# Patient Record
Sex: Male | Born: 1975 | Race: White | Hispanic: No | Marital: Married
Health system: Western US, Academic
[De-identification: ages and names within clinical notes are randomized; demographics above are authoritative.]

---

## 2019-03-09 ENCOUNTER — Telehealth (INDEPENDENT_AMBULATORY_CARE_PROVIDER_SITE_OTHER): Payer: Self-pay | Admitting: Pulmonary Disease

## 2019-03-09 NOTE — Telephone Encounter (Signed)
Called patient to ask him to activate myc hart for next weeks video appointment     Left voice message stating email with link was send to activate my chart

## 2019-03-16 ENCOUNTER — Institutional Professional Consult (permissible substitution) (INDEPENDENT_AMBULATORY_CARE_PROVIDER_SITE_OTHER): Payer: TRICARE Prime—HMO | Admitting: Pulmonary Disease

## 2019-03-16 ENCOUNTER — Encounter (INDEPENDENT_AMBULATORY_CARE_PROVIDER_SITE_OTHER): Payer: Self-pay

## 2020-10-04 IMAGING — MR MRI SHOULDER LT WO CONTRAST
4 series · 40 of 40 positions shown · non-contrast
Comparison: None.

INDICATION: Left arm numbness and tingling.
TECHNIQUE: Multiplanar, multiecho imaging of the left shoulder was performed, including T1-weighted and fluid sensitive sequences without intravenous contrast.

[Series 2: t2_axial_fs · axial · 4.0mm · 0.50mm/px · z∈[-59,+17]mm · 8 of 20 slices shown]
[im 1/20]
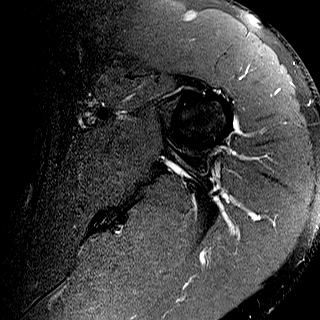
[im 3/20]
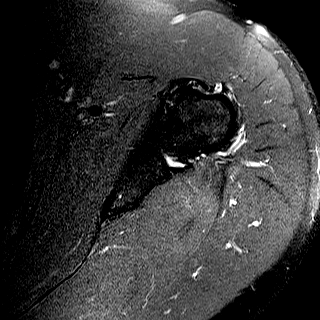
[im 6/20]
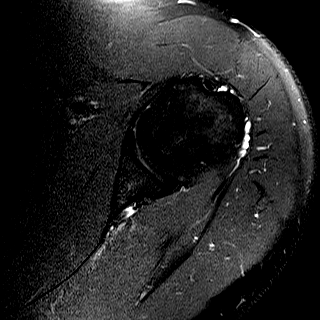
[im 9/20]
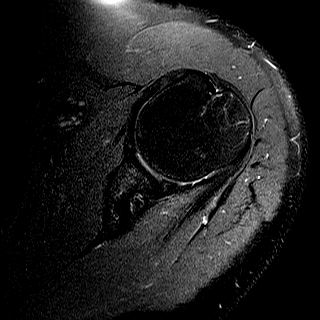
[im 11/20]
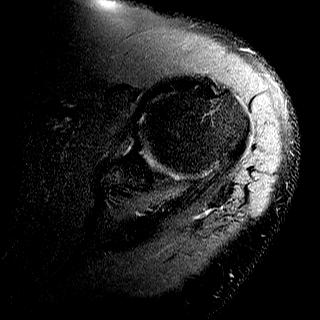
[im 14/20]
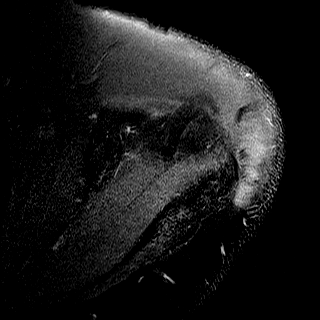
[im 17/20]
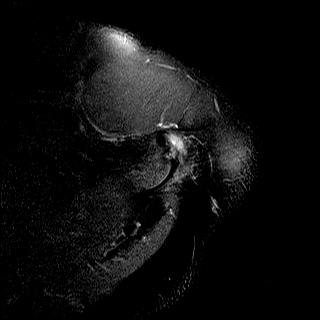
[im 20/20]
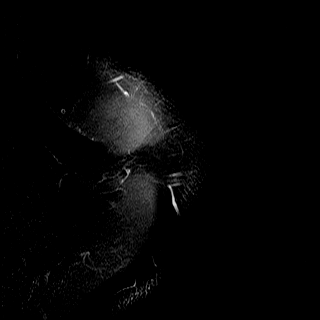

[Series 3: t2_cor_obl_fs · oblique · 4.0mm · 0.50mm/px · 8 of 18 slices shown]
[im 1/18]
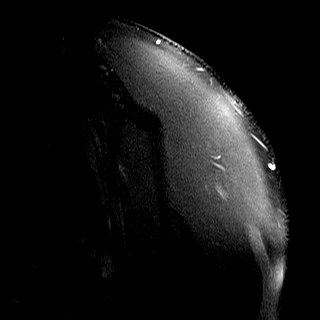
[im 3/18]
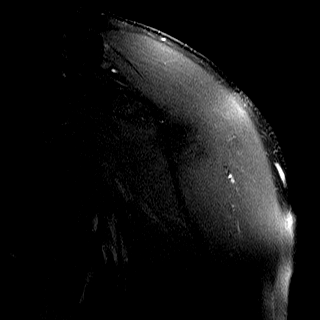
[im 5/18]
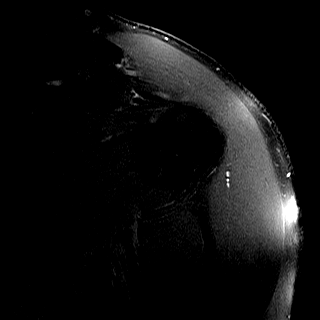
[im 8/18]
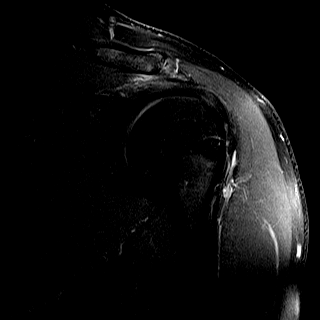
[im 10/18]
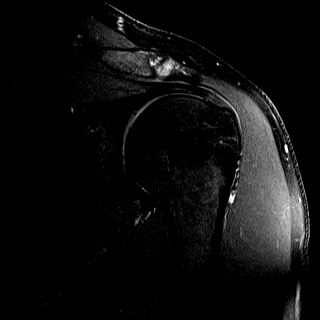
[im 13/18]
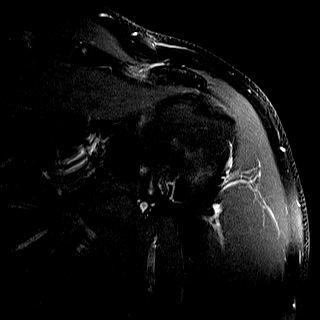
[im 15/18]
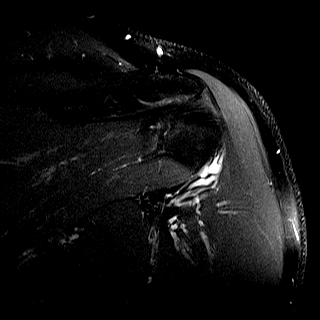
[im 18/18]
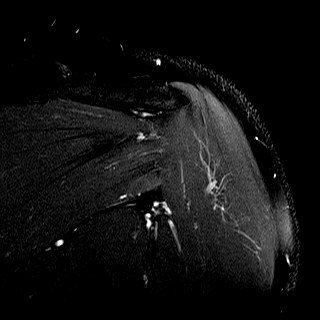

[Series 4: t1_sag_obl · oblique · 4.0mm · 0.50mm/px · 12 of 27 slices shown]
[im 1/27]
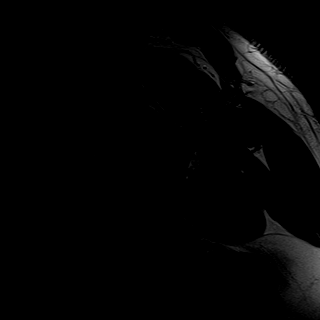
[im 3/27]
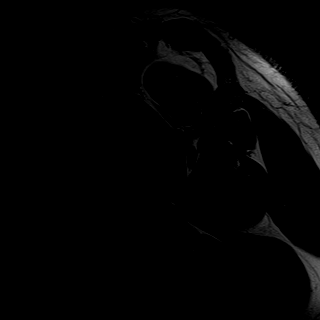
[im 5/27]
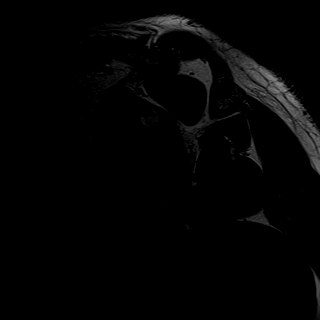
[im 8/27]
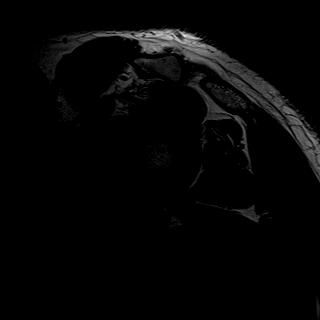
[im 10/27]
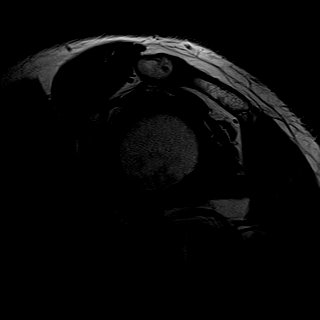
[im 12/27]
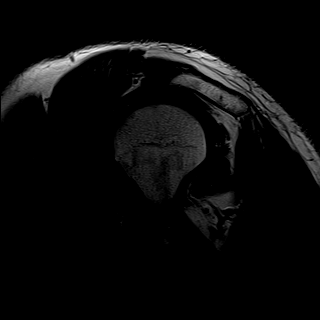
[im 15/27]
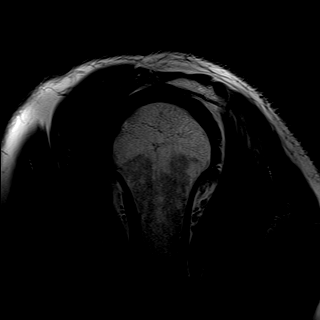
[im 17/27]
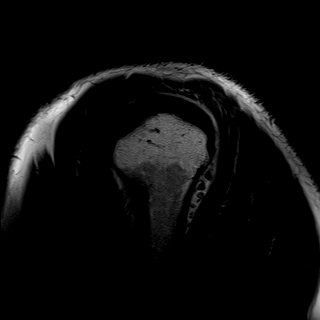
[im 19/27]
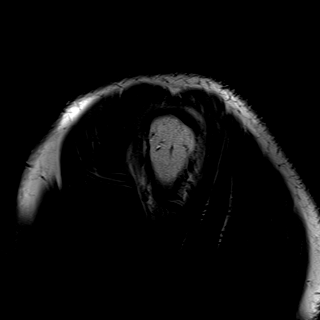
[im 22/27]
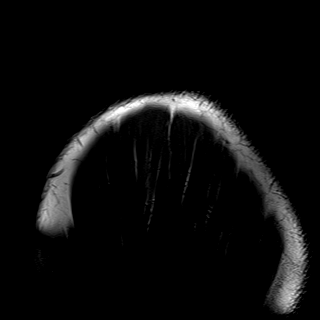
[im 24/27]
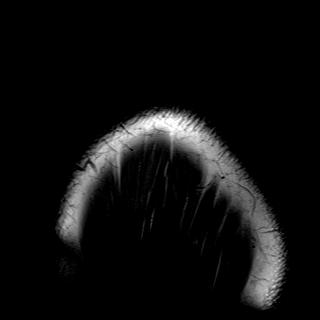
[im 27/27]
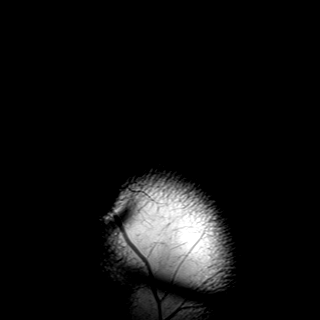

[Series 5: t2_sag_obl_fs · oblique · 4.0mm · 0.50mm/px · 12 of 27 slices shown]
[im 1/27]
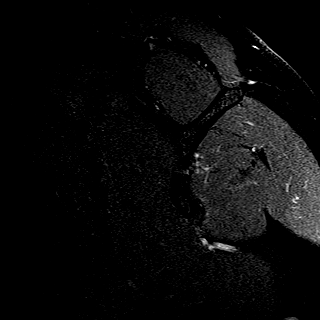
[im 3/27]
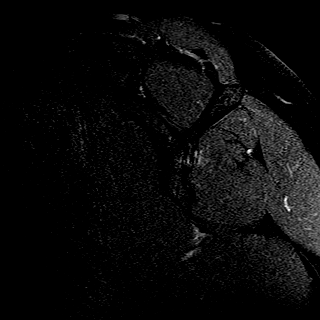
[im 5/27]
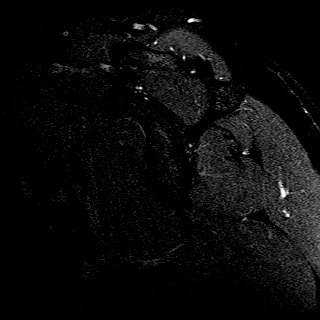
[im 8/27]
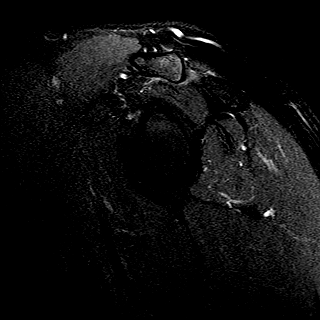
[im 10/27]
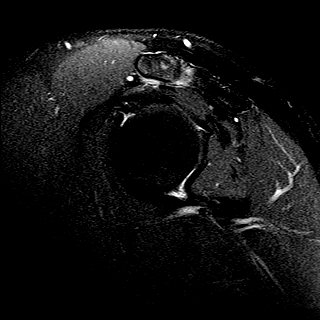
[im 12/27]
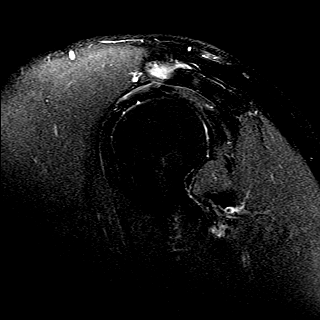
[im 15/27]
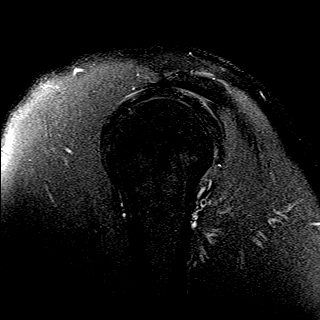
[im 17/27]
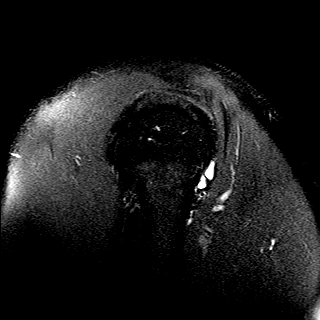
[im 19/27]
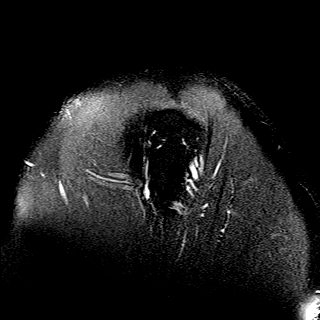
[im 22/27]
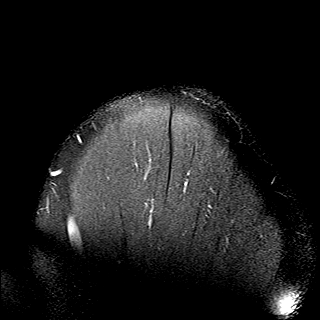
[im 24/27]
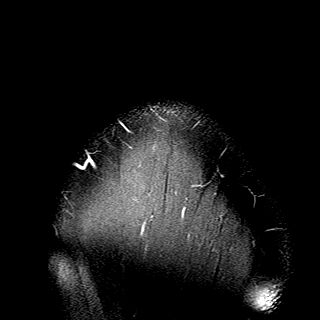
[im 27/27]
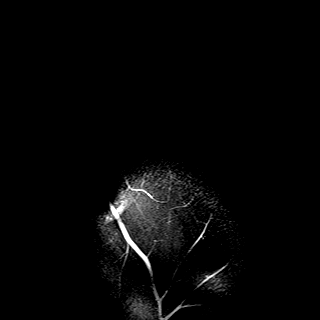

[40 of 40 positions shown; findings below may reference images not displayed]

FINDINGS: Rotator cuff:

Supraspinatus and infraspinatus: Trace tendinosis of the supraspinatus. No tear. Infraspinatus tendon is intact.

Subscapularis: Intact.

Teres minor: Intact. 

Cuff muscles: Normal in size and signal intensity.  No fatty infiltration.

Acromioclavicular joint: Mild DJD.

FRANK DOZZO bursa: No bursitis.

Long head biceps tendon: Intact, with normal course.

Rotator Interval: No scar or obliteration of fat.

Labrum: Degeneration of the posterior superior labrum without definitive tear.

Cartilage: Intact.

Marrow: Within normal limits.

No joint effusion. No mass. No fluid collection.
IMPRESSION: 1.
Degeneration of the posterior superior labrum with no definitive tear.

2.
Mild AC joint DJD.

## 2020-10-04 IMAGING — MR MRI CSPINE WO CONTRAST
5 series · 43 of 48 positions shown · non-contrast
Comparison: None

INDICATION: Left arm numbness and tingling.
TECHNIQUE: Multiplanar, multiecho imaging of the cervical spine was performed, including T1-weighted and fluid sensitive sequences without Intravenous contrast.

[Series 1: bSSFP · axial · 6.0mm · 1.17mm/px · z∈[-66,+147]mm · 11 of 22 slices shown]
[im 1/22]
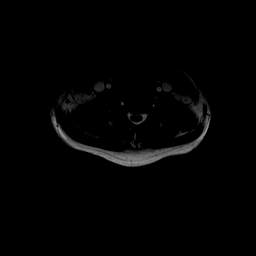
[im 3/22]
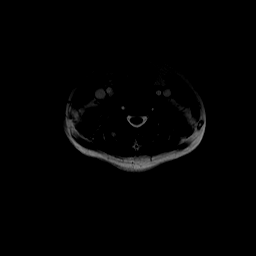
[im 5/22]
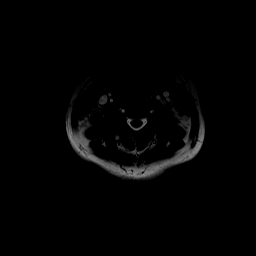
[im 7/22]
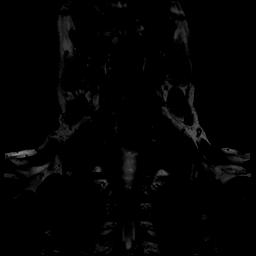
[im 9/22]
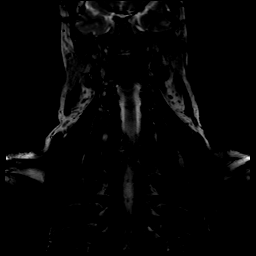
[im 11/22]
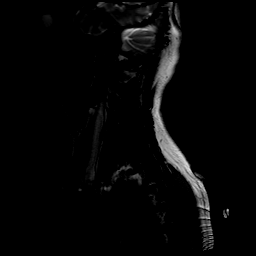
[im 13/22]
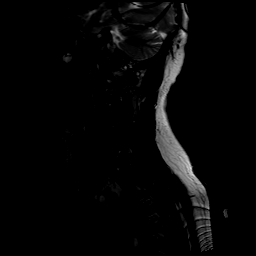
[im 15/22]
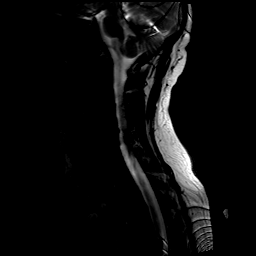
[im 17/22]
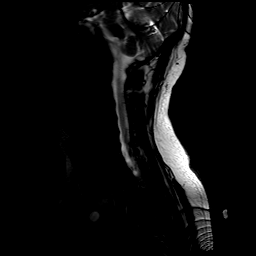
[im 19/22]
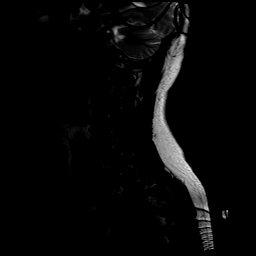
[im 22/22]
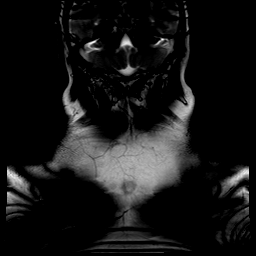

[Series 2: t2_sag · sagittal · 3.0mm · 0.66mm/px · 7 of 14 slices shown]
[im 1/14]
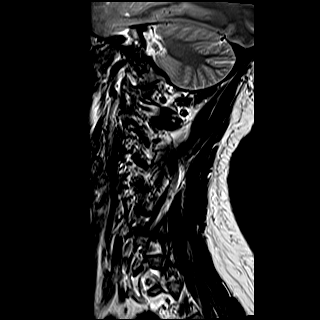
[im 3/14]
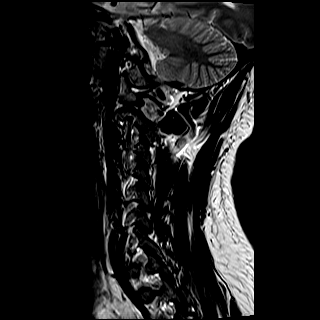
[im 5/14]
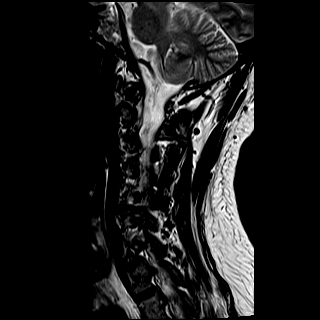
[im 7/14]
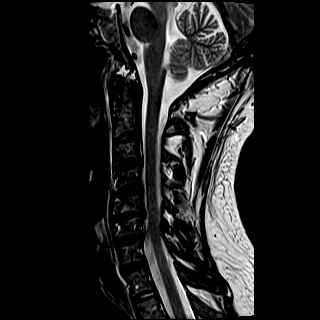
[im 9/14]
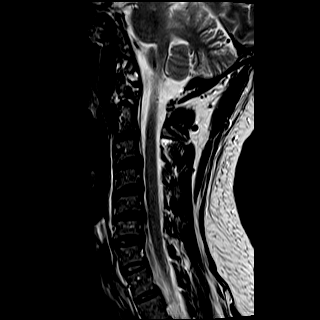
[im 11/14]
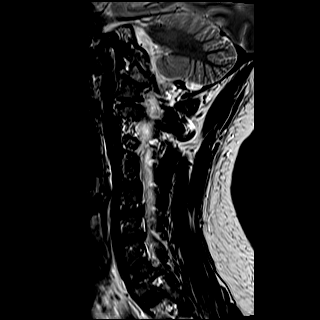
[im 14/14]
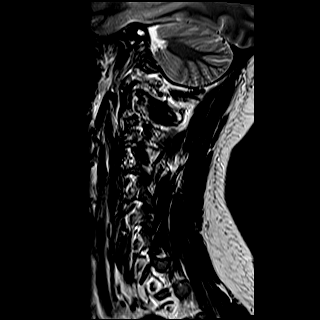

[Series 3: t1_sag · sagittal · 3.0mm · 0.66mm/px · 7 of 14 slices shown]
[im 1/14]
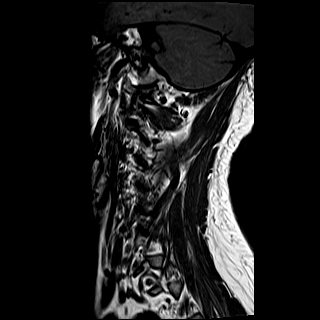
[im 3/14]
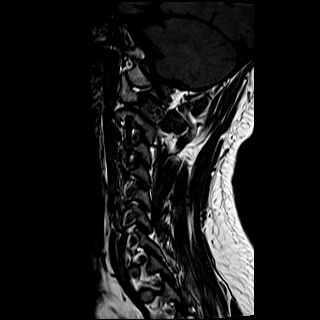
[im 5/14]
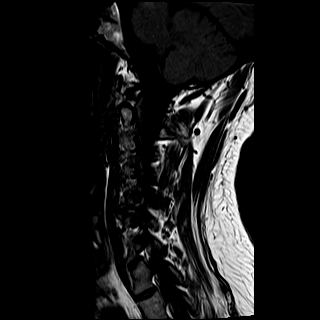
[im 7/14]
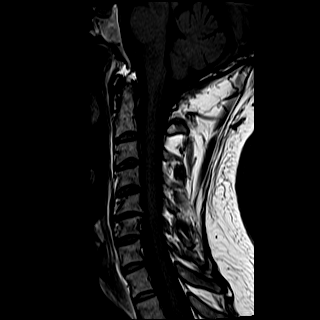
[im 9/14]
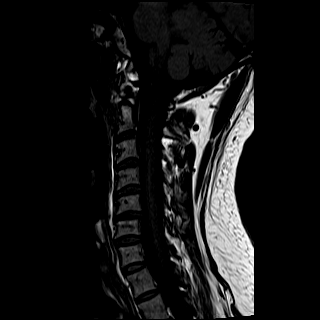
[im 11/14]
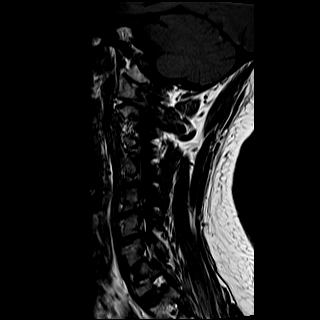
[im 14/14]
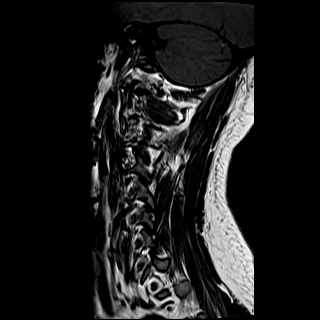

[Series 4: ir_sag · sagittal · 3.0mm · 0.82mm/px · 7 of 14 slices shown]
[im 1/14]
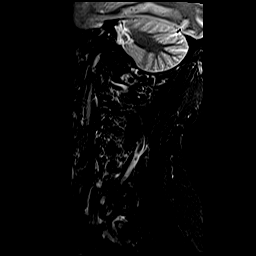
[im 3/14]
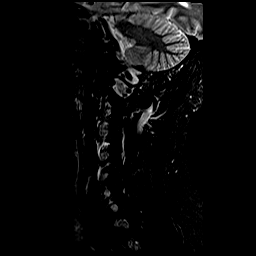
[im 5/14]
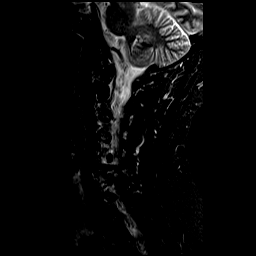
[im 7/14]
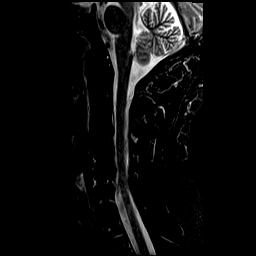
[im 9/14]
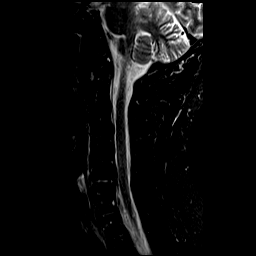
[im 11/14]
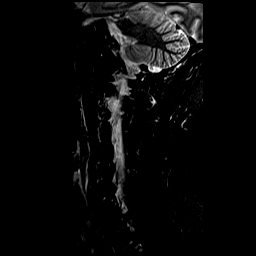
[im 14/14]
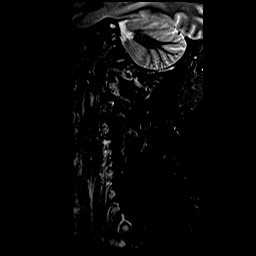

[Series 5: t2_medic_axial · axial · 3.0mm · 0.29mm/px · z∈[-74,+50]mm · 11 of 33 slices shown]
[im 1/33]
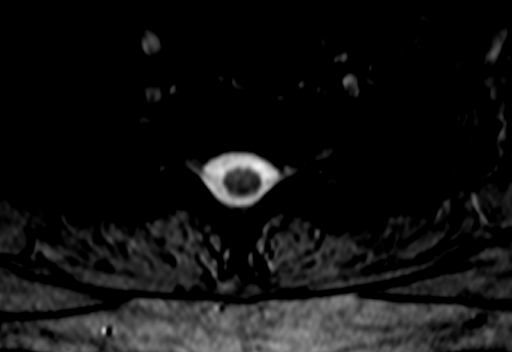
[im 3/33]
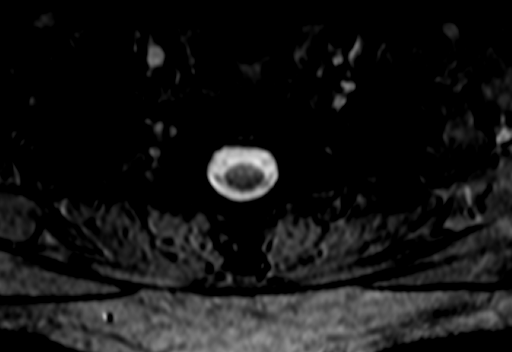
[im 5/33]
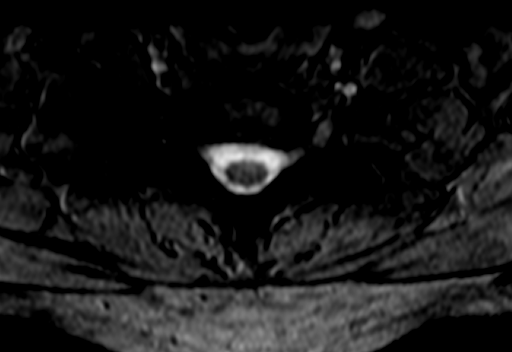
[im 7/33]
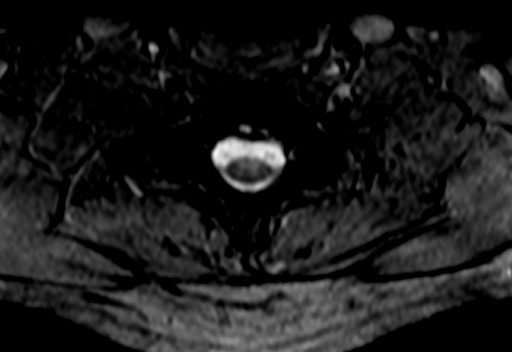
[im 9/33]
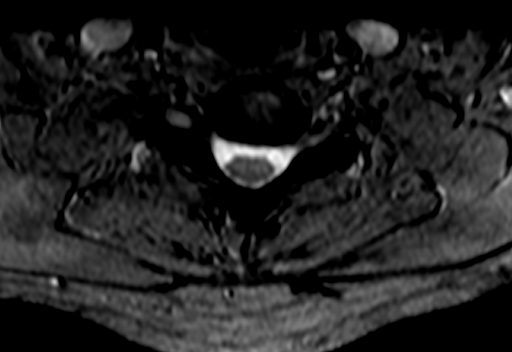
[im 11/33]
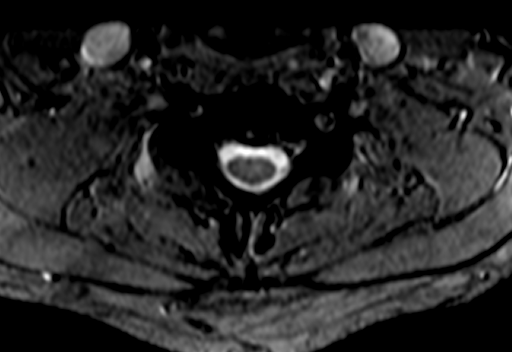
[im 15/33]
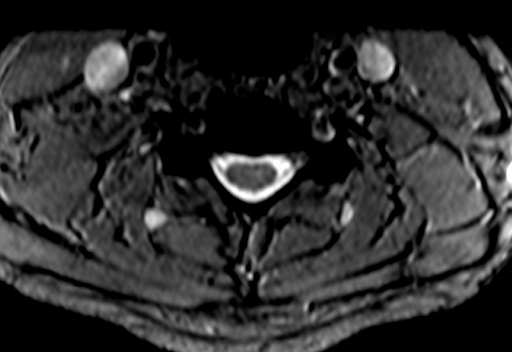
[im 20/33]
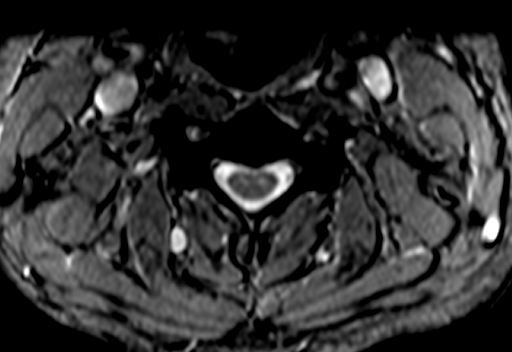
[im 24/33]
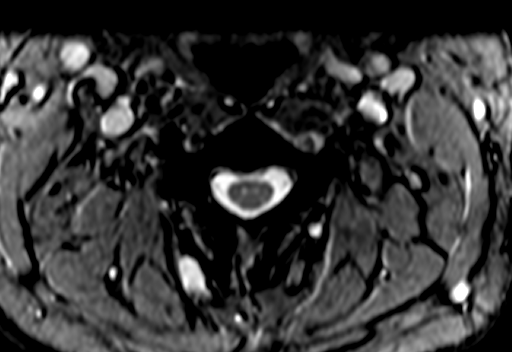
[im 28/33]
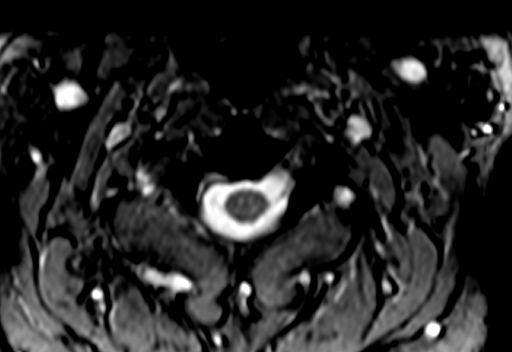
[im 33/33]
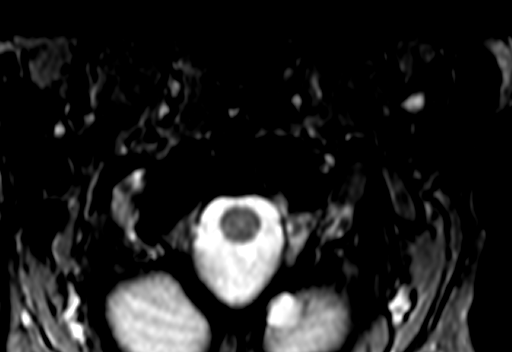

[43 of 48 positions shown; findings below may reference images not displayed]

FINDINGS: Posterior fossa:  The visualized posterior fossa is normal in appearance.  

Cervical spinal cord:  The cervical spinal cord has normal signal intensity.  

Bone marrow:  Mild discogenic endplate reactive marrow changes at C5-C6. No fracture. 

Alignment: Minimal cervical levoscoliosis. No listhesis.

C2-C3: No disc protrusion, canal narrowing, or foraminal narrowing.

C3-C4: Mild disc osteophyte complex. No foraminal stenosis. Mild canal stenosis. No facet arthrosis.

C4-C5: Mild disc osteophyte complex. Mild canal stenosis. No foraminal stenosis. No facet arthrosis.

C5-C6: Mild disc osteophyte complex. Mild canal stenosis. Moderate right and mild left foraminal stenosis. No facet arthrosis.

C6-C7: Mild disc osteophyte complex. No canal stenosis. Mild right foraminal stenosis. No facet arthrosis.

C7-T1: No disc protrusion, canal narrowing, or foraminal narrowing.

T1-T2: No disc protrusion. No canal or foraminal stenosis. No facet arthrosis.

Paravertebral soft tissues are unremarkable.
IMPRESSION: 1.
Mild disc osteophyte complex at C5-C6 with mild canal and moderate right foraminal stenosis.

2.
Mild disc osteophyte complex at C3-C5 with mild canal stenosis.

## 2020-10-16 IMAGING — MR MRI BRAIN W/WO CONTRAST
10 of 16 series · 31 of 48 positions shown · IV contrast (prohance)
Comparison: Cervical spine MR study 10/04/2020.

HISTORY: Abnormal hormones, hypothyroidism, possible adenoma
TECHNIQUE: Multiplanar, multisequential MRI images of the brain and pituitary gland are obtained prior to and following 20 mL ProHance intravenous contrast. Dynamic pituitary gland enhancement protocol utilized.

[Series 1: bSSFP · axial · 8.0mm · 1.17mm/px · z∈[-43,+129]mm · 5 of 19 slices shown]
[im 1/19]
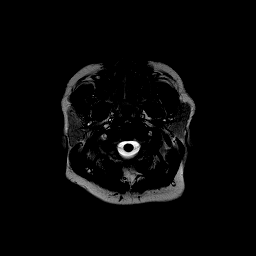
[im 5/19]
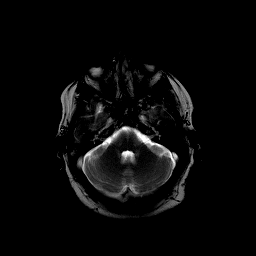
[im 10/19]
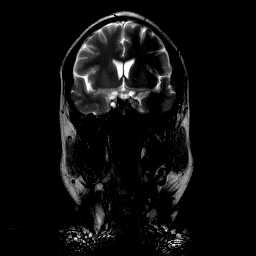
[im 14/19]
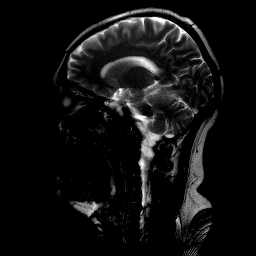
[im 19/19]
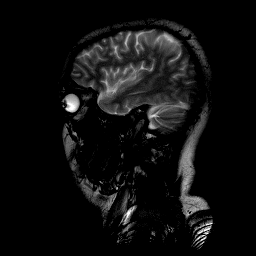

[Series 2: t1_sag · sagittal · 5.0mm · 0.75mm/px · 4 of 20 slices shown (1 of 2)]
[im 1/20]
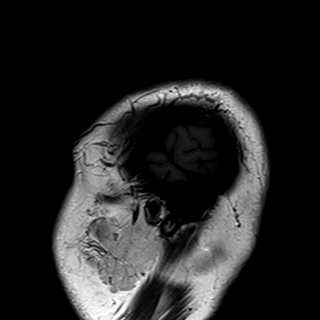
[im 7/20]
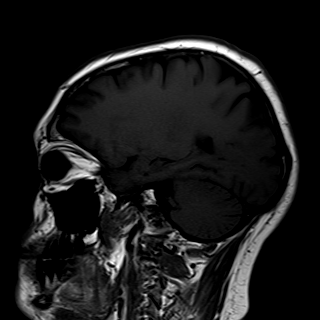
[im 13/20]
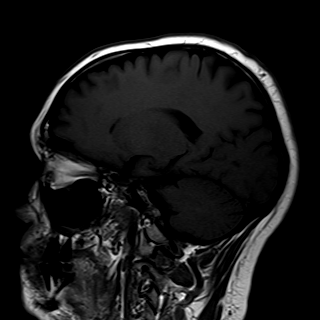
[im 20/20]
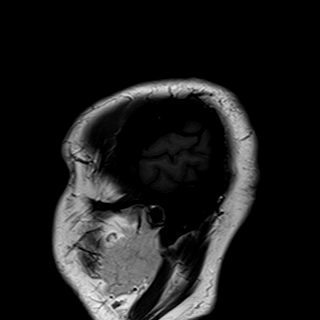

[Series 3: flair_axial_fs · axial · 5.0mm · 0.45mm/px · z∈[-51,+104]mm · 6 of 27 slices shown]
[im 1/27]
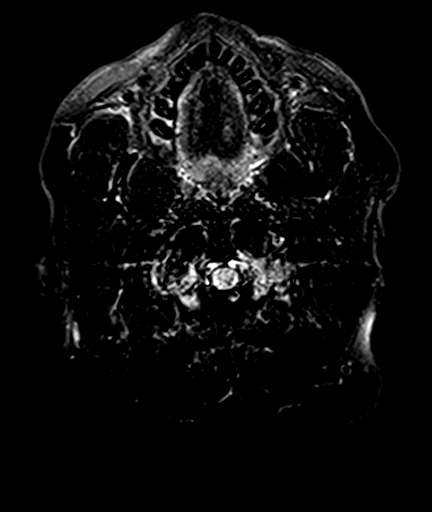
[im 6/27]
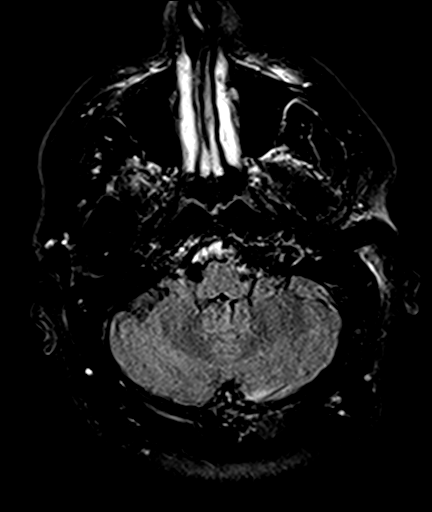
[im 11/27]
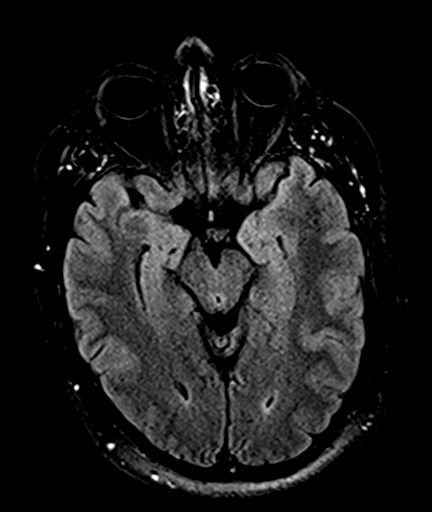
[im 16/27]
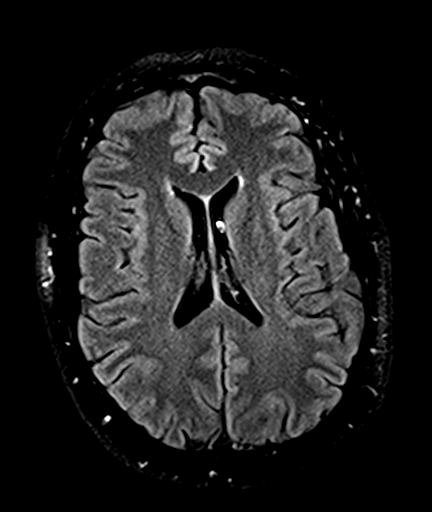
[im 21/27]
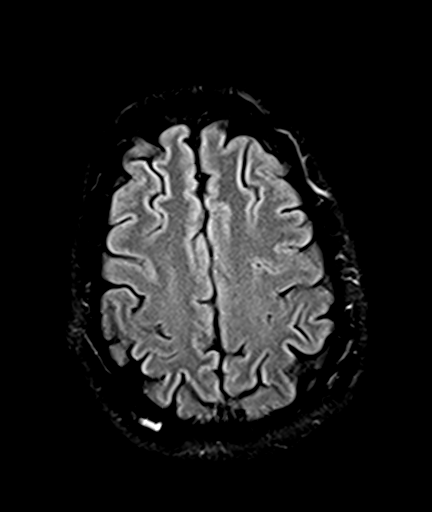
[im 27/27]
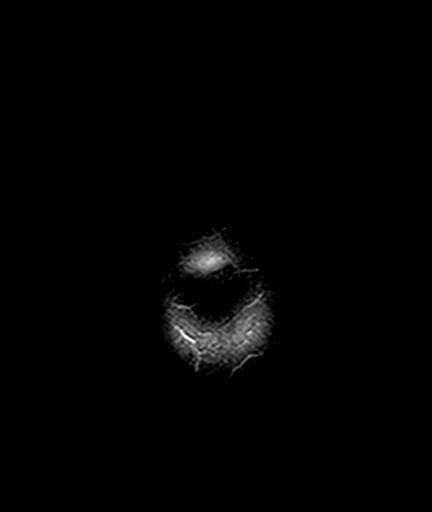

[Series 4: t2_cor_x2.5 · coronal · 3.0mm · 0.78mm/px · 3 of 15 slices shown]
[im 1/15]
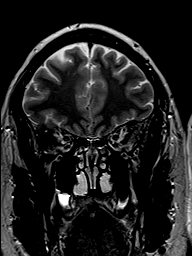
[im 8/15]
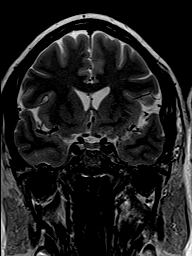
[im 15/15]
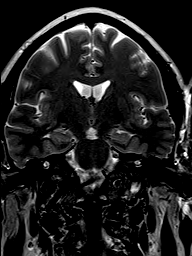

[Series 5: t1_sag · sagittal · 3.0mm · 0.44mm/px · 2 of 11 slices shown (2 of 2)]
[im 1/11]
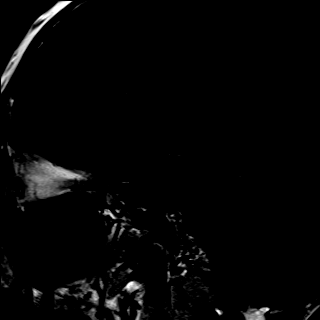
[im 11/11]
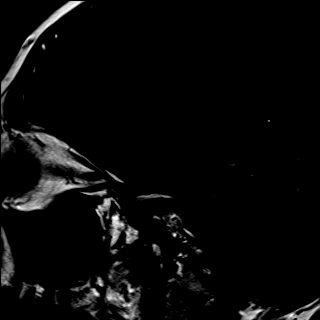

[Series 6: t1_cor_fs · coronal · 3.0mm · 0.78mm/px · 3 of 13 slices shown]
[im 1/13]
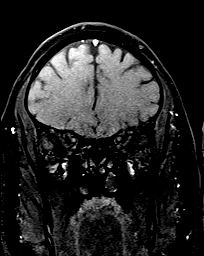
[im 7/13]
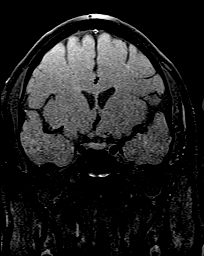
[im 13/13]
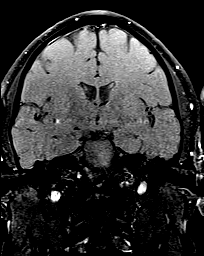

[Series 7: t1_cor · coronal · 3.0mm · 0.31mm/px · 2 of 9 slices shown]
[im 1/9]
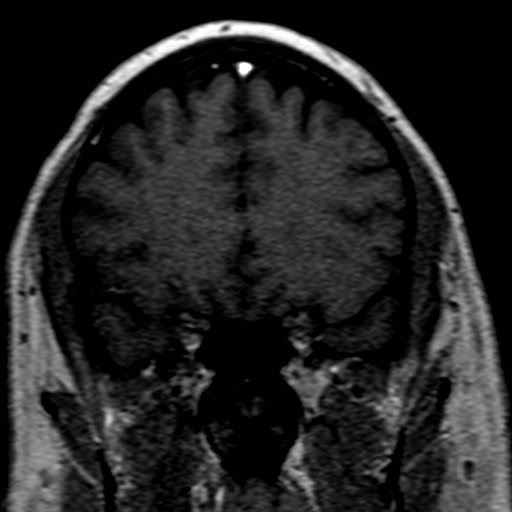
[im 9/9]
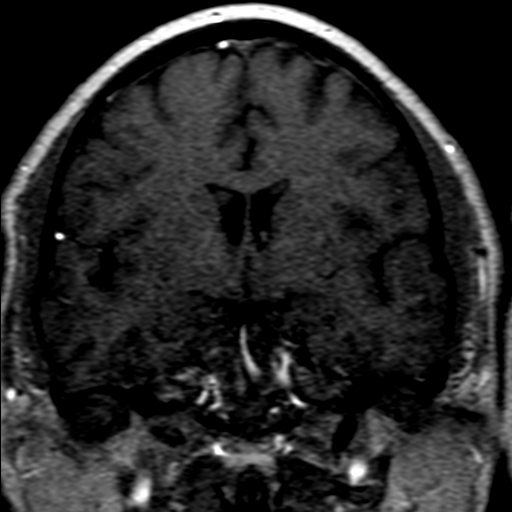

[Series 8: t1_cor_0 · coronal · 3.0mm · 0.31mm/px · 2 of 9 slices shown]
[im 1/9]
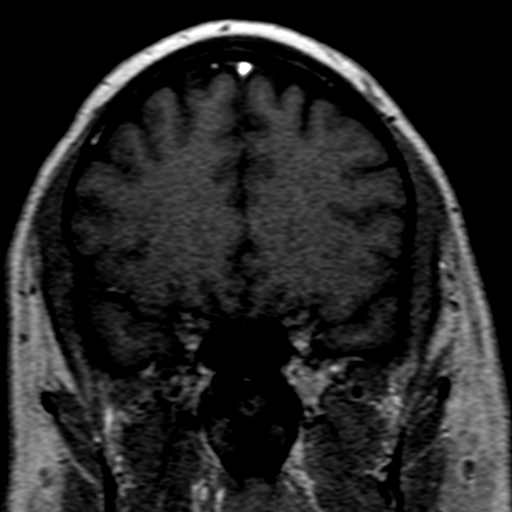
[im 9/9]
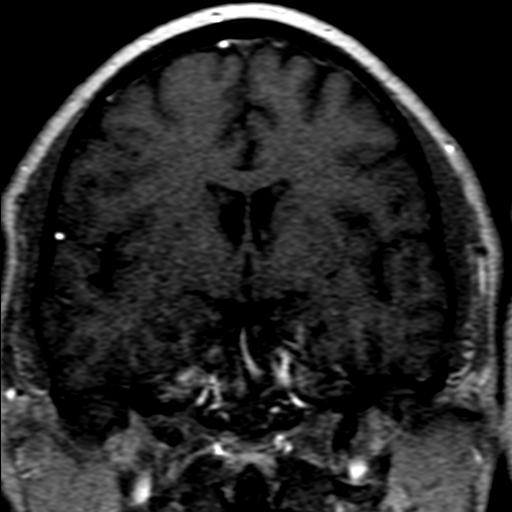

[Series 9: t1_cor_30 · coronal · 3.0mm · 0.31mm/px · 2 of 9 slices shown]
[im 1/9]
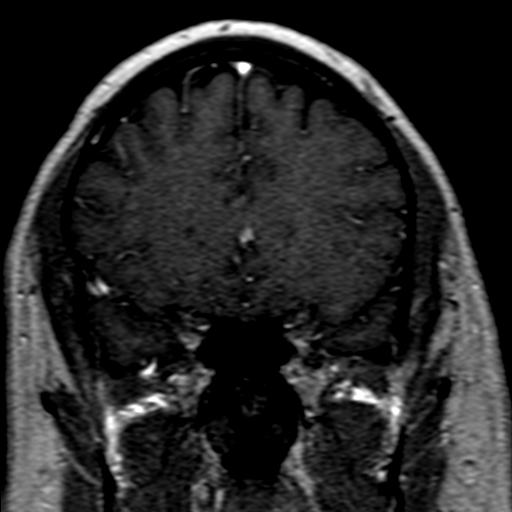
[im 9/9]
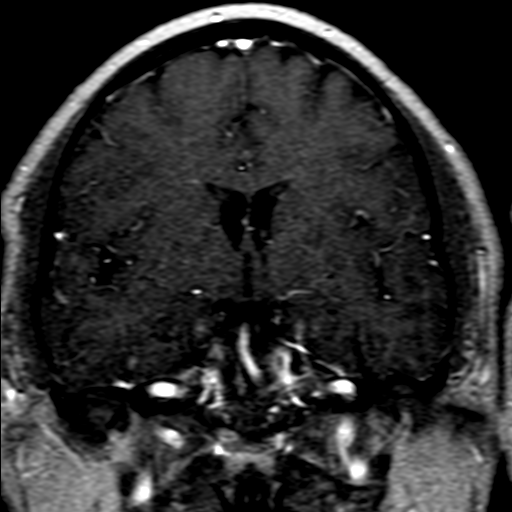

[Series 10: t1_cor_60 · coronal · 3.0mm · 0.31mm/px · 2 of 9 slices shown]
[im 1/9]
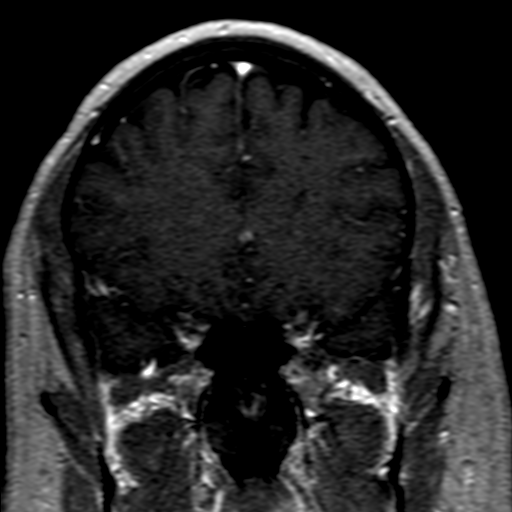
[im 9/9]
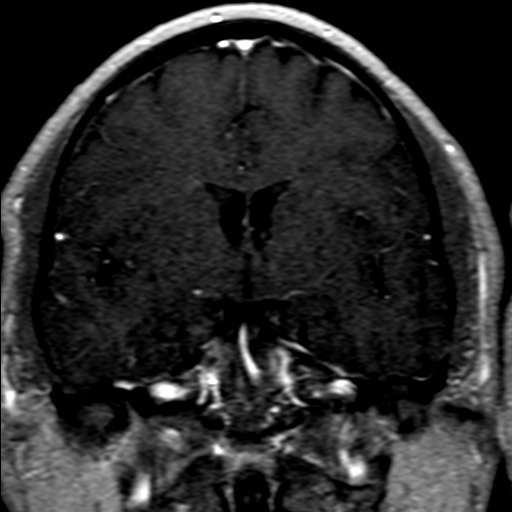

[31 of 48 positions shown; findings below may reference images not displayed]

FINDINGS: Ventricles and cortical sulci are normal. There is no abnormal intra-axial signal. Midline intracranial structures normal. Flow voids of main intracranial vasculature are normal. There is no intracranial mass lesion or midline shift. No extra-axial fluid collection identified.

Mild mucosal thickening of ethmoid air cells identified. Mucous retention cysts or polyp at inferior aspect of right maxillary sinus seen. The rest of the orbits, paranasal sinuses, mastoid air cells and skull marrow signal are normal.

Pituitary gland shows normal morphology and signal. Pituitary stalk is normal. No pituitary adenoma is identified. No abnormal enhancement of pituitary gland is seen. Cavernous sinuses and Meckel's caves are normal. Optic tracts, optic chiasma and optic nerves are normal.
IMPRESSION: Chronic paranasal sinusitis identified.

No pituitary adenoma is identified.

No intracranial pathology.

## 2020-11-03 IMAGING — US DOP ARTERIAL LWR EXT RT
1 series · 14 of 16 positions shown · non-contrast
Comparison: None.

HISTORY: 45 years-old male with pain in the right leg.
TECHNIQUE: Multiple gray-scale and color-flow images of the right lower extremity arteries were obtained with ultrasound.  Spectral Doppler analysis was also performed.

[Series 1: dop arterial lwr ext right · arterial · 14 of 32 slices shown]
[im 1/32]
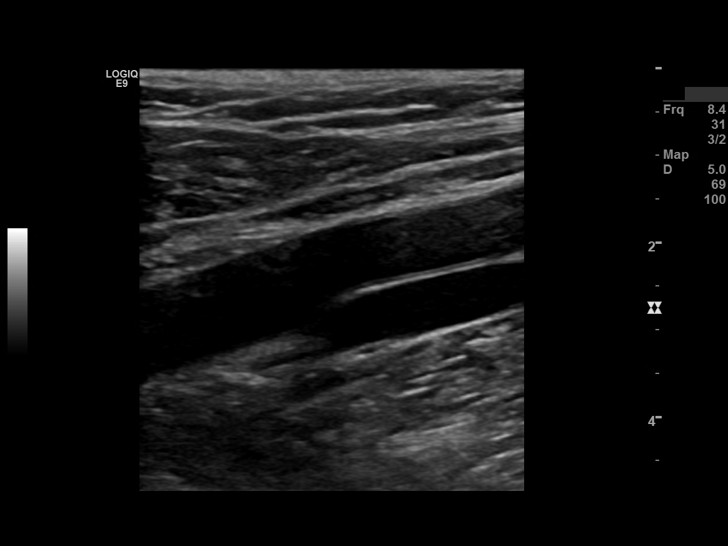
[im 3/32]
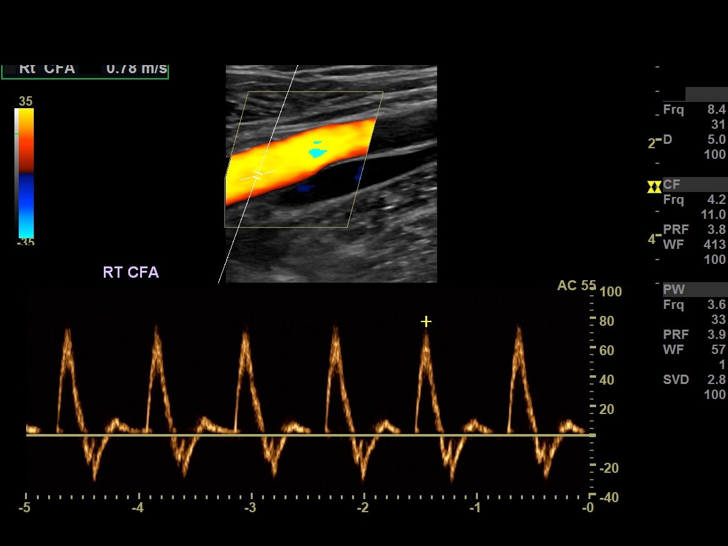
[im 5/32]
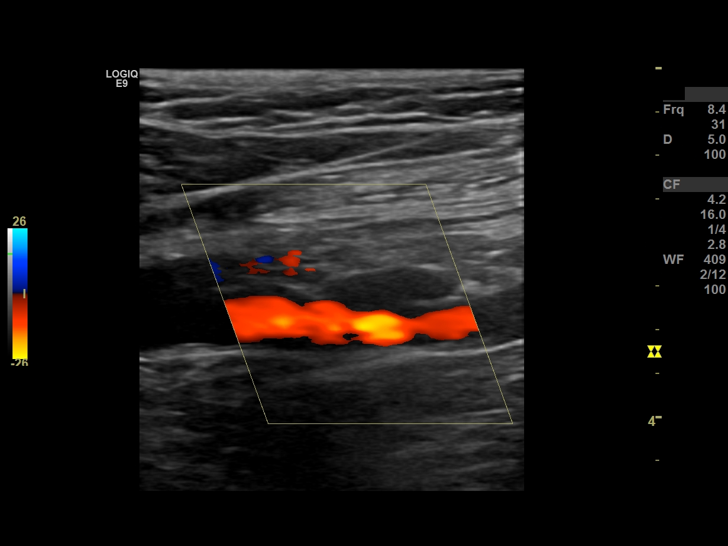
[im 9/32]
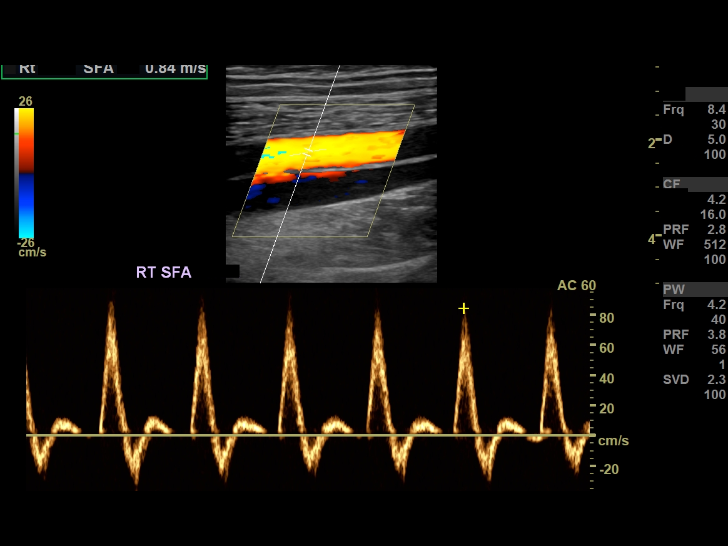
[im 11/32]
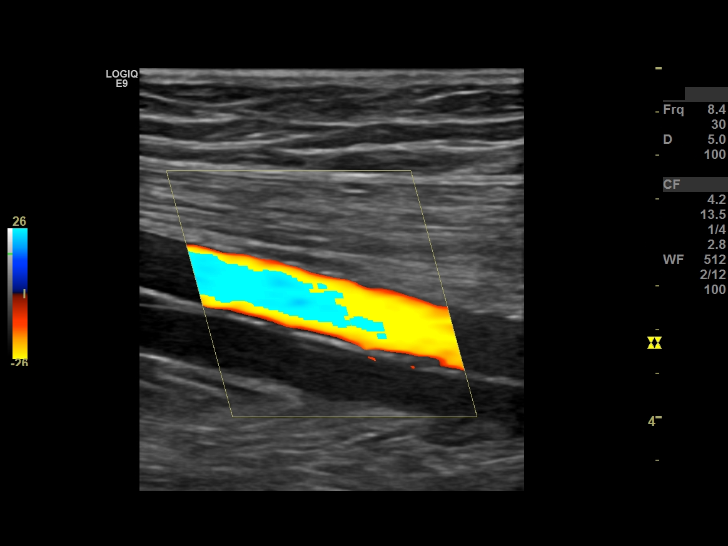
[im 13/32]
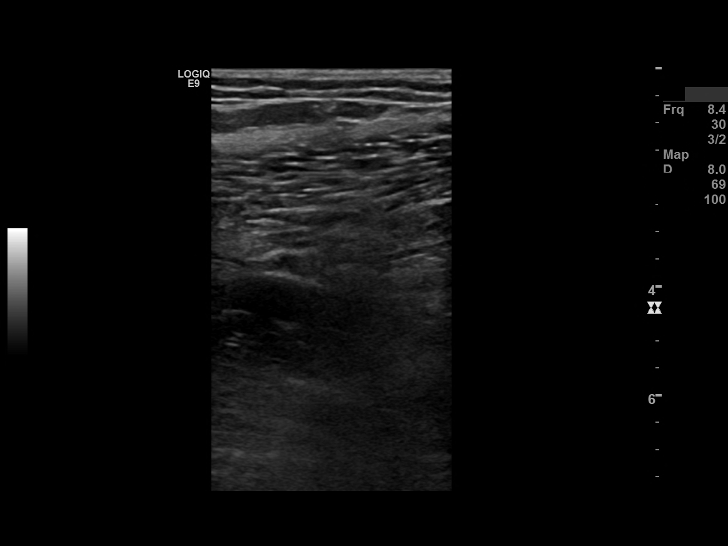
[im 15/32]
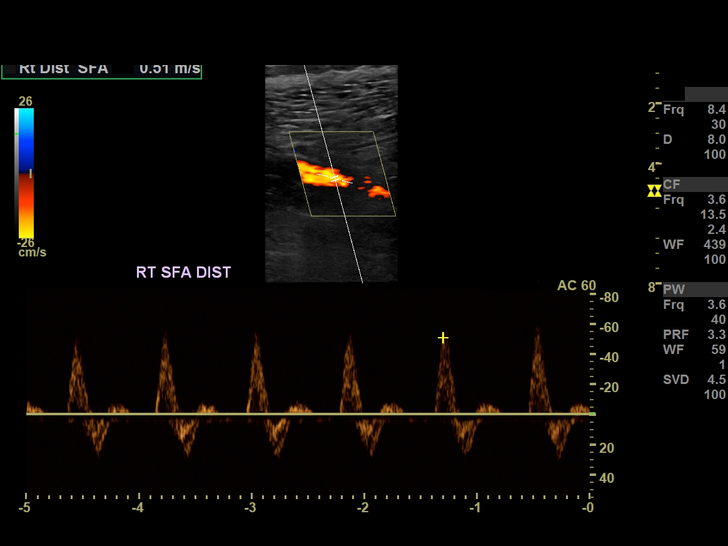
[im 17/32]
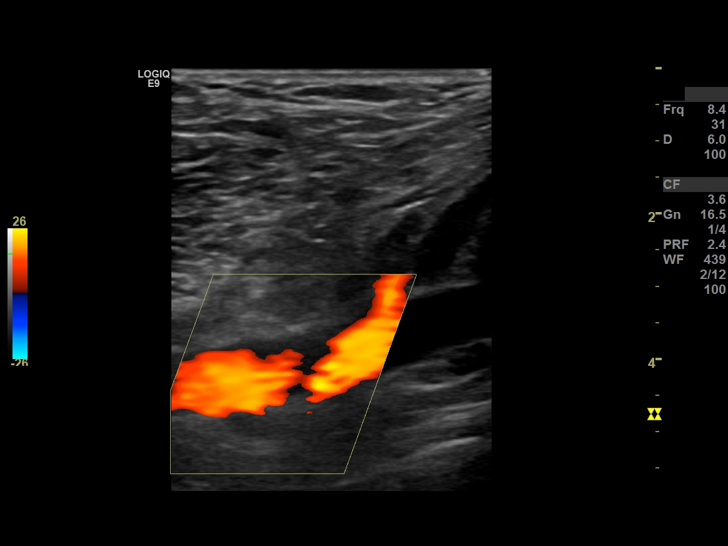
[im 19/32]
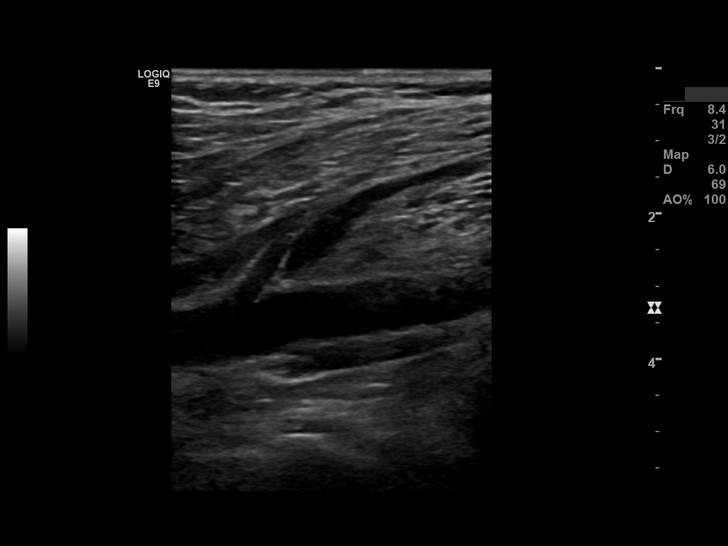
[im 21/32]
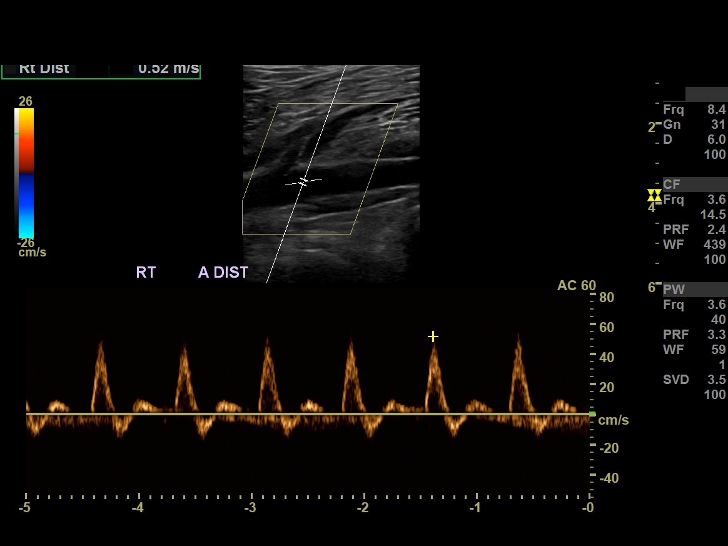
[im 25/32]
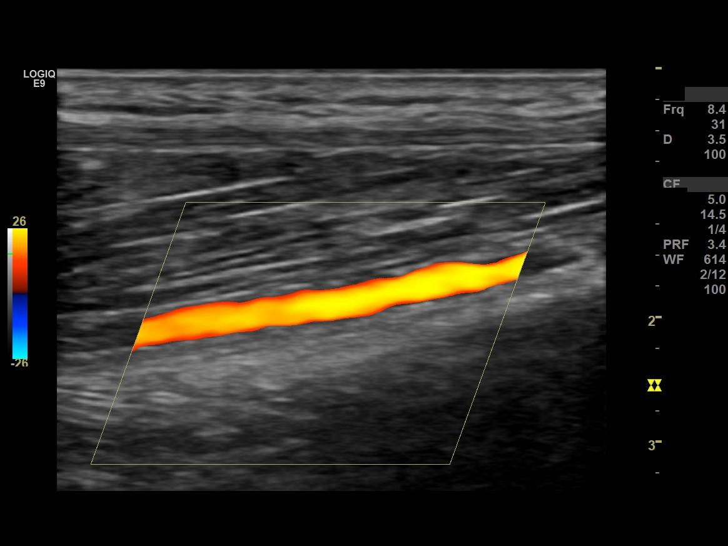
[im 27/32]
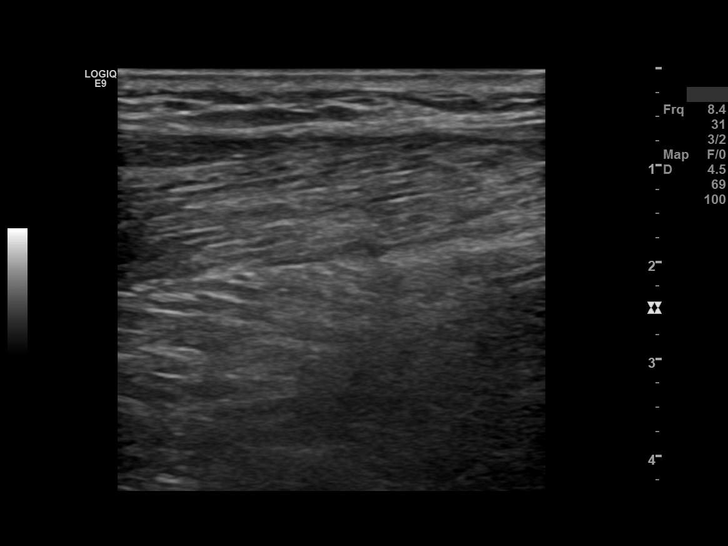
[im 29/32]
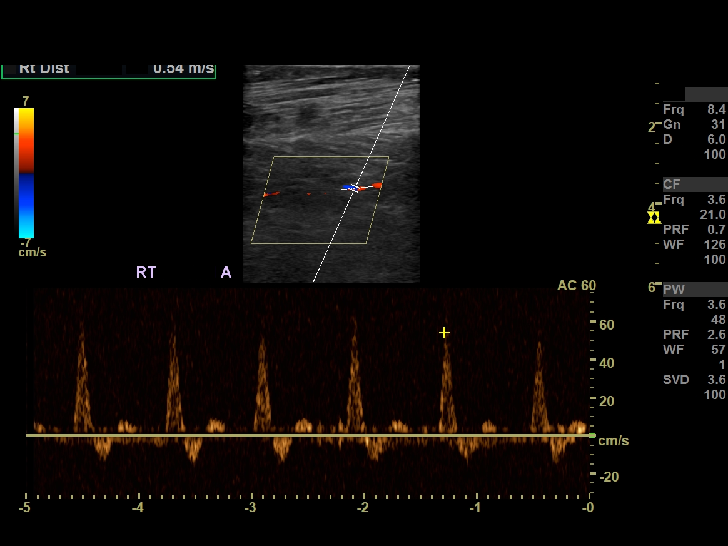
[im 32/32]
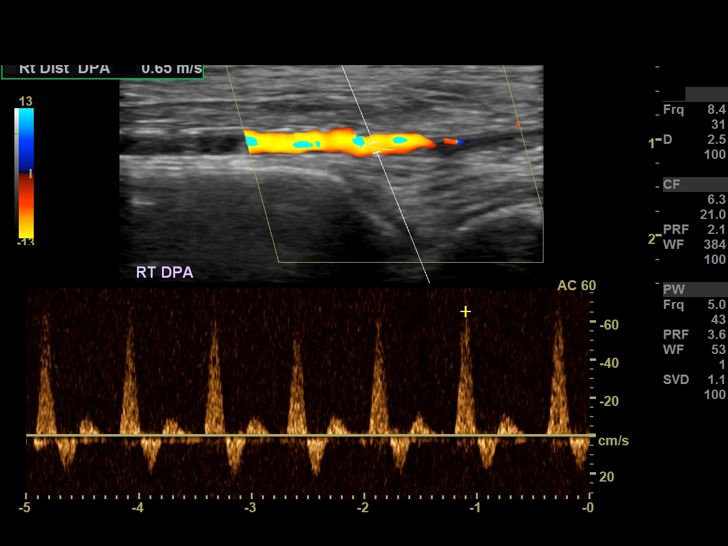

[14 of 16 positions shown; findings below may reference images not displayed]

FINDINGS: The right leg arterial system demonstrates the following waveforms and peak systolic velocities (m/s):

Common femoral artery: Triphasic,

Profunda femoral artery: Triphasic,

Superficial femoral artery proximal: Triphasic,

Superficial femoral artery mid: Triphasic,

Superficial femoral artery distal: Triphasic,

Popliteal artery proximal: Triphasic,

Popliteal artery distal: Triphasic,

Anterior tibial artery: Triphasic,

Posterior tibial artery: Triphasic,

Peroneal artery: Triphasic,

Dorsalis pedis artery: Triphasic,

Cardiac rhythm: Regular.
IMPRESSION: No significant peripheral vascular disease in the right leg arterial system.

## 2020-11-10 IMAGING — RF URETHROCYSTOGRAM RETRO
5 series · 10 of 10 positions shown · non-contrast
Comparison: None

HISTORY/INDICATIONS:  Urethral stricture.

PROCEDURE:  Retrograde urethrogram.
RADIATION DOSE:
Fluoroscopic images: 22
Fluoroscopy time: 2.1 minutes
Dose area product: 2274.58 microGy*m2
Reference air kerma: 47.10 mGy
DESCRIPTION OF PROCEDURE:
After cannulation of the urethra at the penile urethra and injection of contrast, images of the anterior urethra were performed.

[Series 1: fluoro_barium_bariatric 2fps_(person_nam · 0.19mm/px · 1 of 1 slices shown (1 of 5)]
[im 1/1]
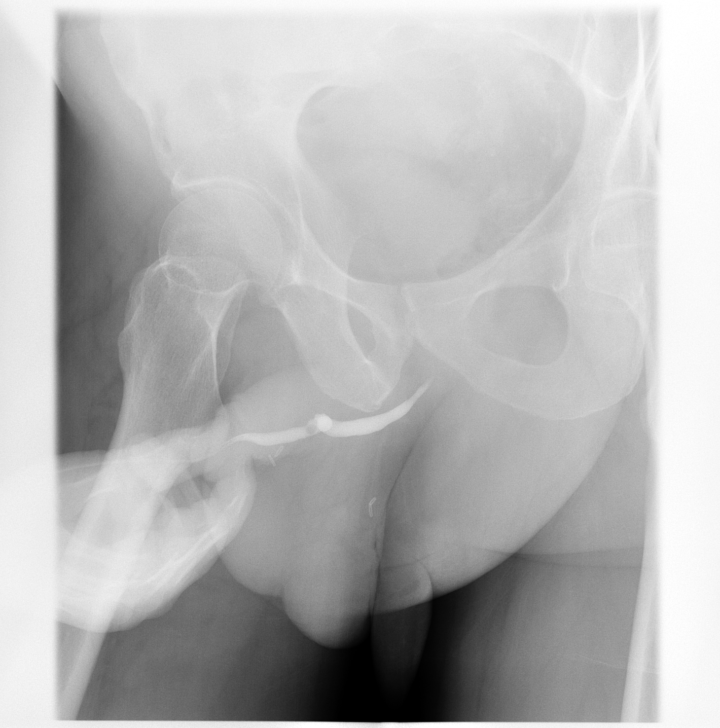

[Series 2: fluoro_barium_bariatric 2fps_(person_nam · 0.19mm/px · 1 of 1 slices shown (2 of 5)]
[im 1/1]
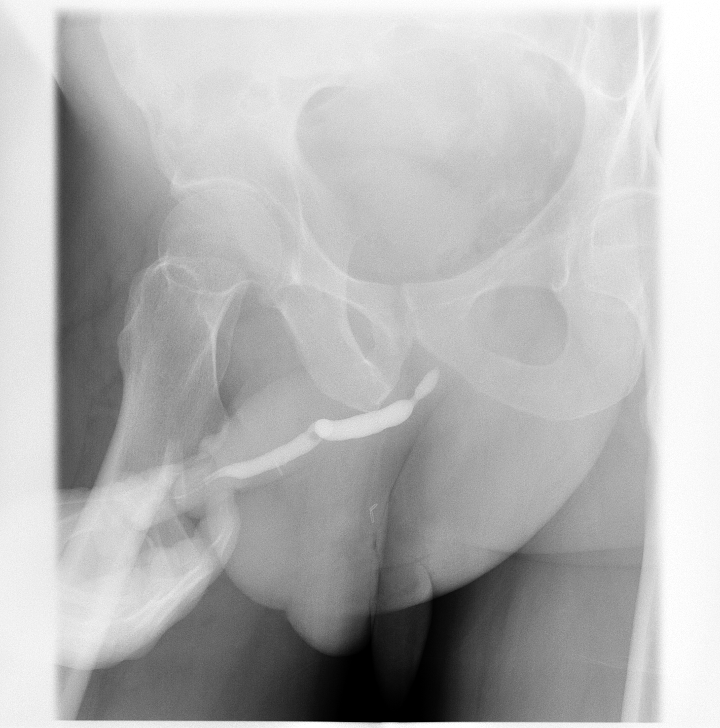

[Series 3: fluoro_barium_bariatric 2fps_(person_nam · 0.19mm/px · 2 of 2 frames shown (3 of 5)]
[frame 1/2]
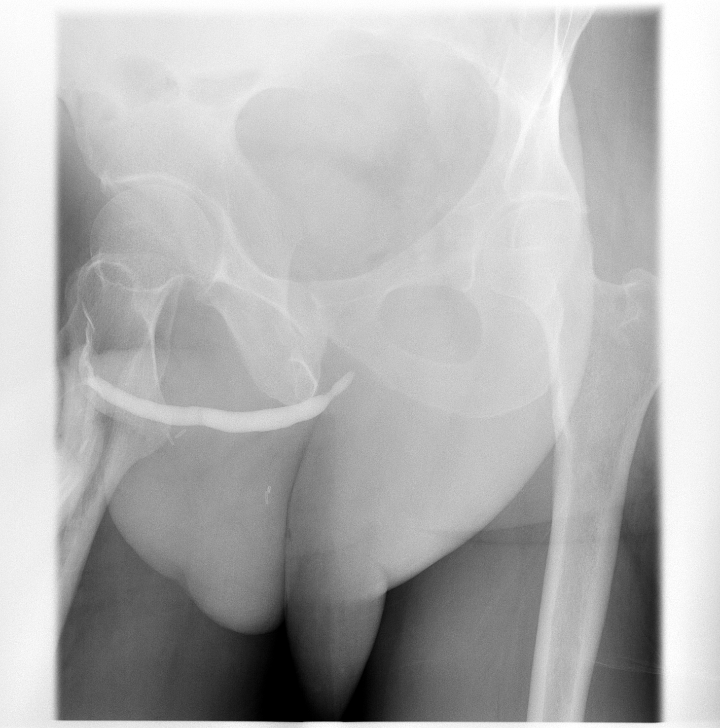
[frame 2/2]
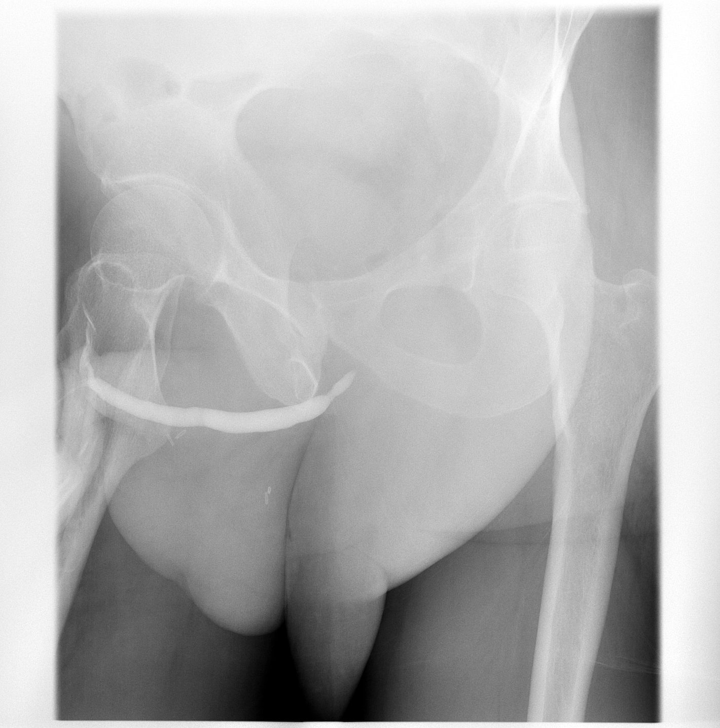

[Series 4: fluoro_barium_bariatric 2fps_(person_nam · 0.19mm/px · 3 of 14 frames shown (4 of 5)]
[frame 3/14]
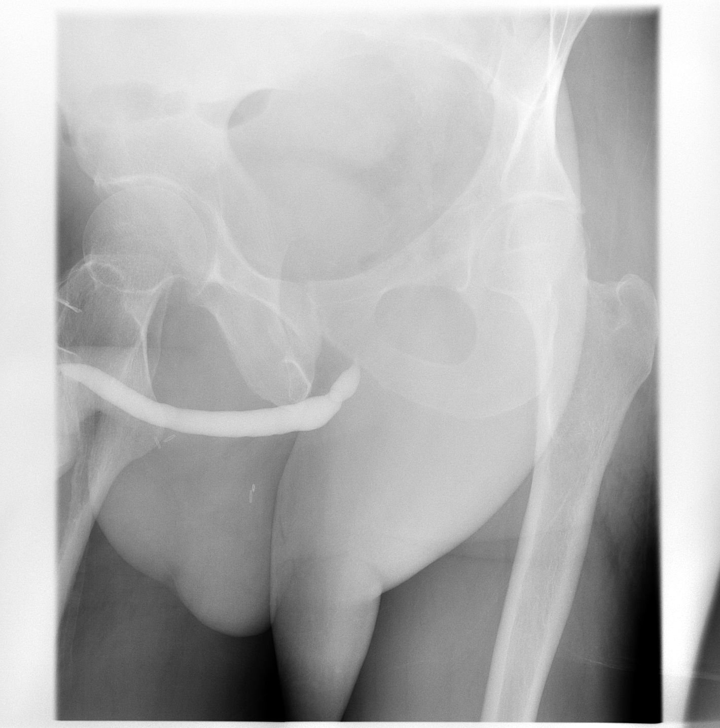
[frame 8/14]
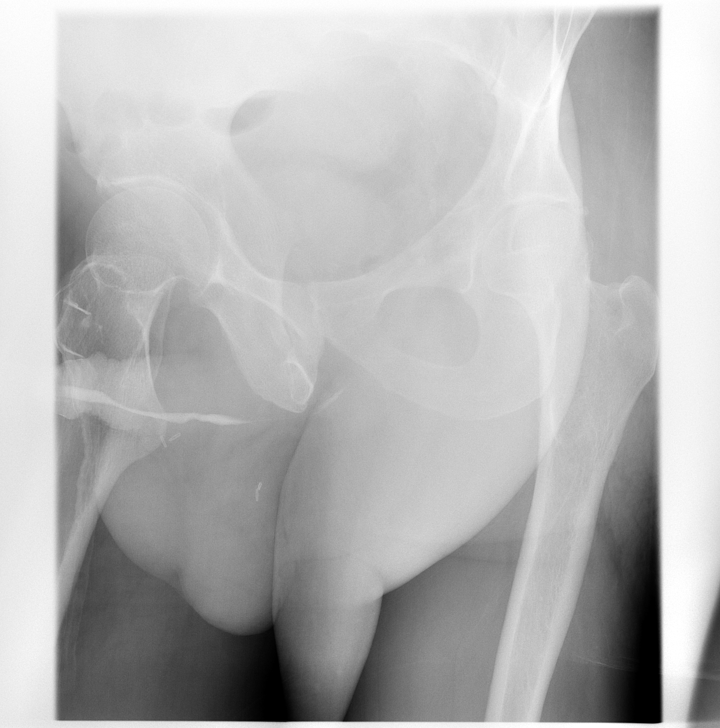
[frame 12/14]
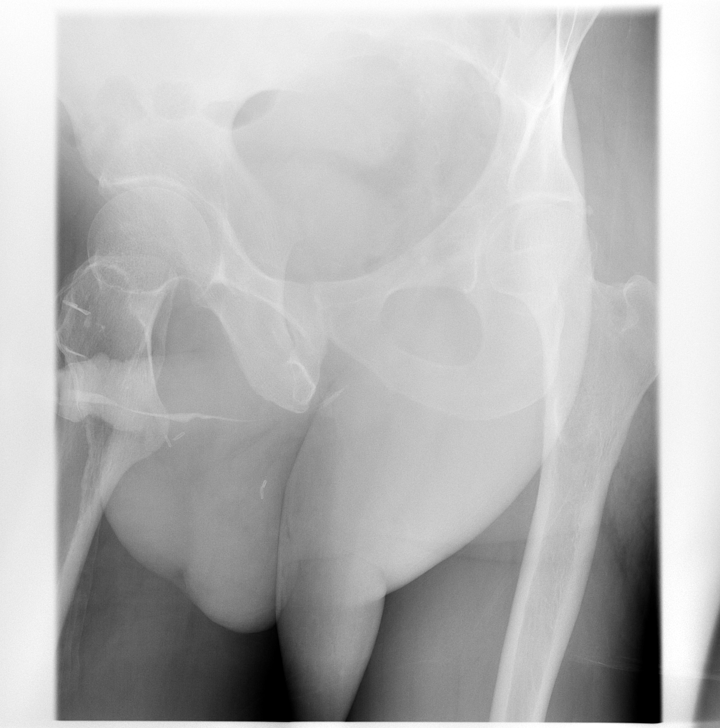

[Series 5: fluoro_barium_bariatric 2fps_(person_nam · 0.19mm/px · 3 of 4 frames shown (5 of 5)]
[frame 1/4]
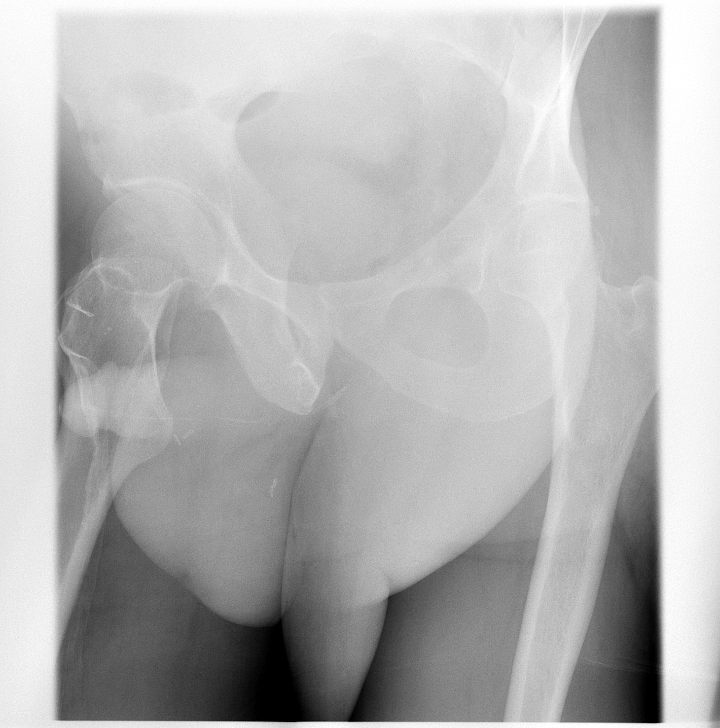
[frame 3/4]
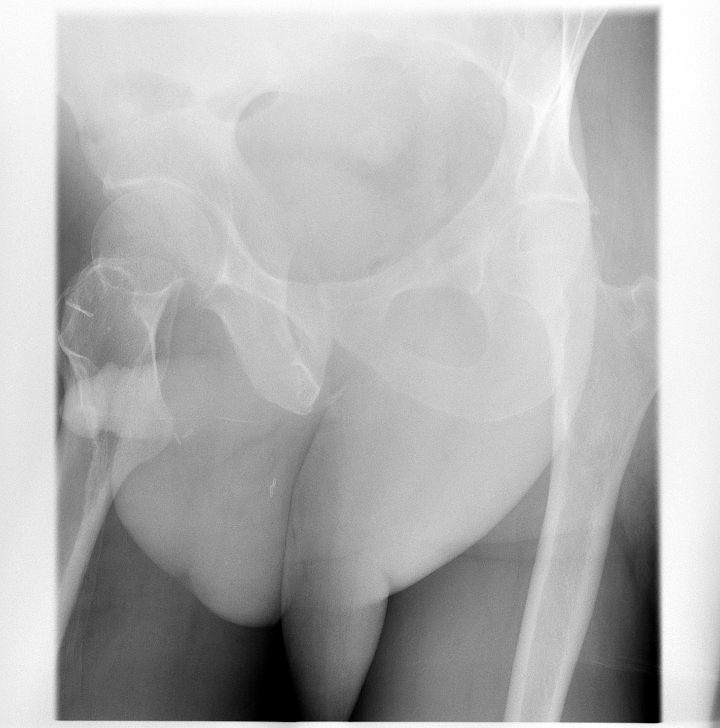
[frame 4/4]
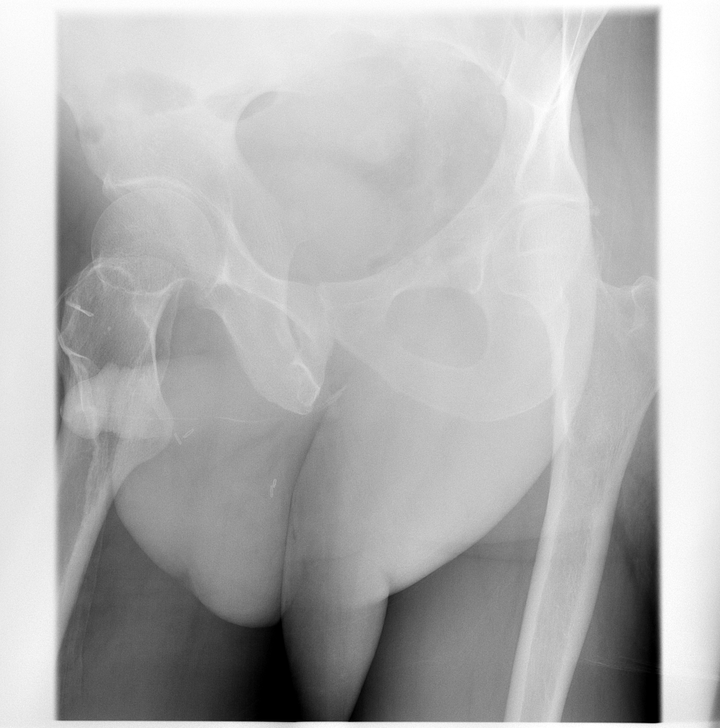

[10 of 10 positions shown; findings below may reference images not displayed]

FINDINGS: After retrograde introduction of contrast, there is a mild 50% stricture of the posterior bulbous urethra just below the external sphincter. The remainder of the bulbous and penile urethra are intact. No postdrainage retention is noted. The posterior urethra was not visualized on the study.
IMPRESSION: 50% stricture of proximal bulbous urethra approximately 1 cm beyond the external sphincter.

## 2020-11-21 IMAGING — CR CHEST 2 VWS PA LAT
2 series · 2 of 2 positions shown · non-contrast
Comparison: None.

HISTORY: 45-year-old Male with shortness of breath
TECHNIQUE: 2 views of chest.

[w chest pa]
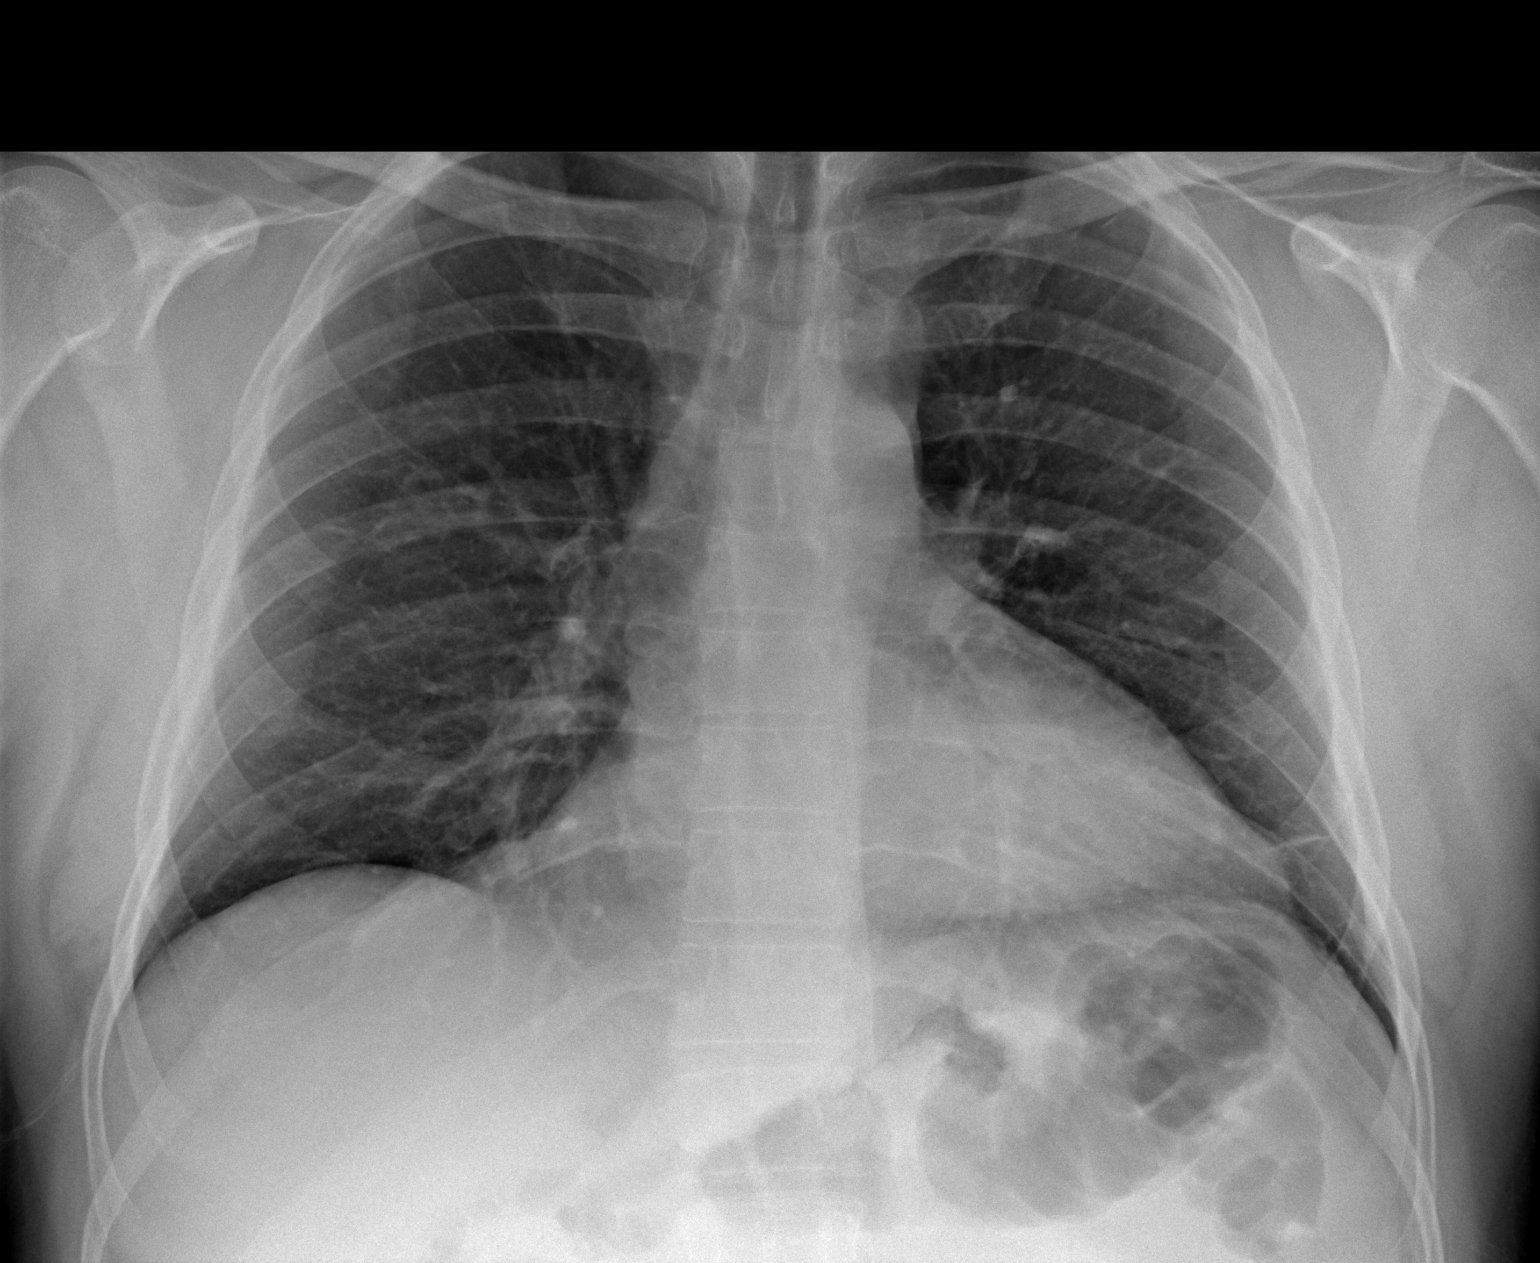

[w chest lat]
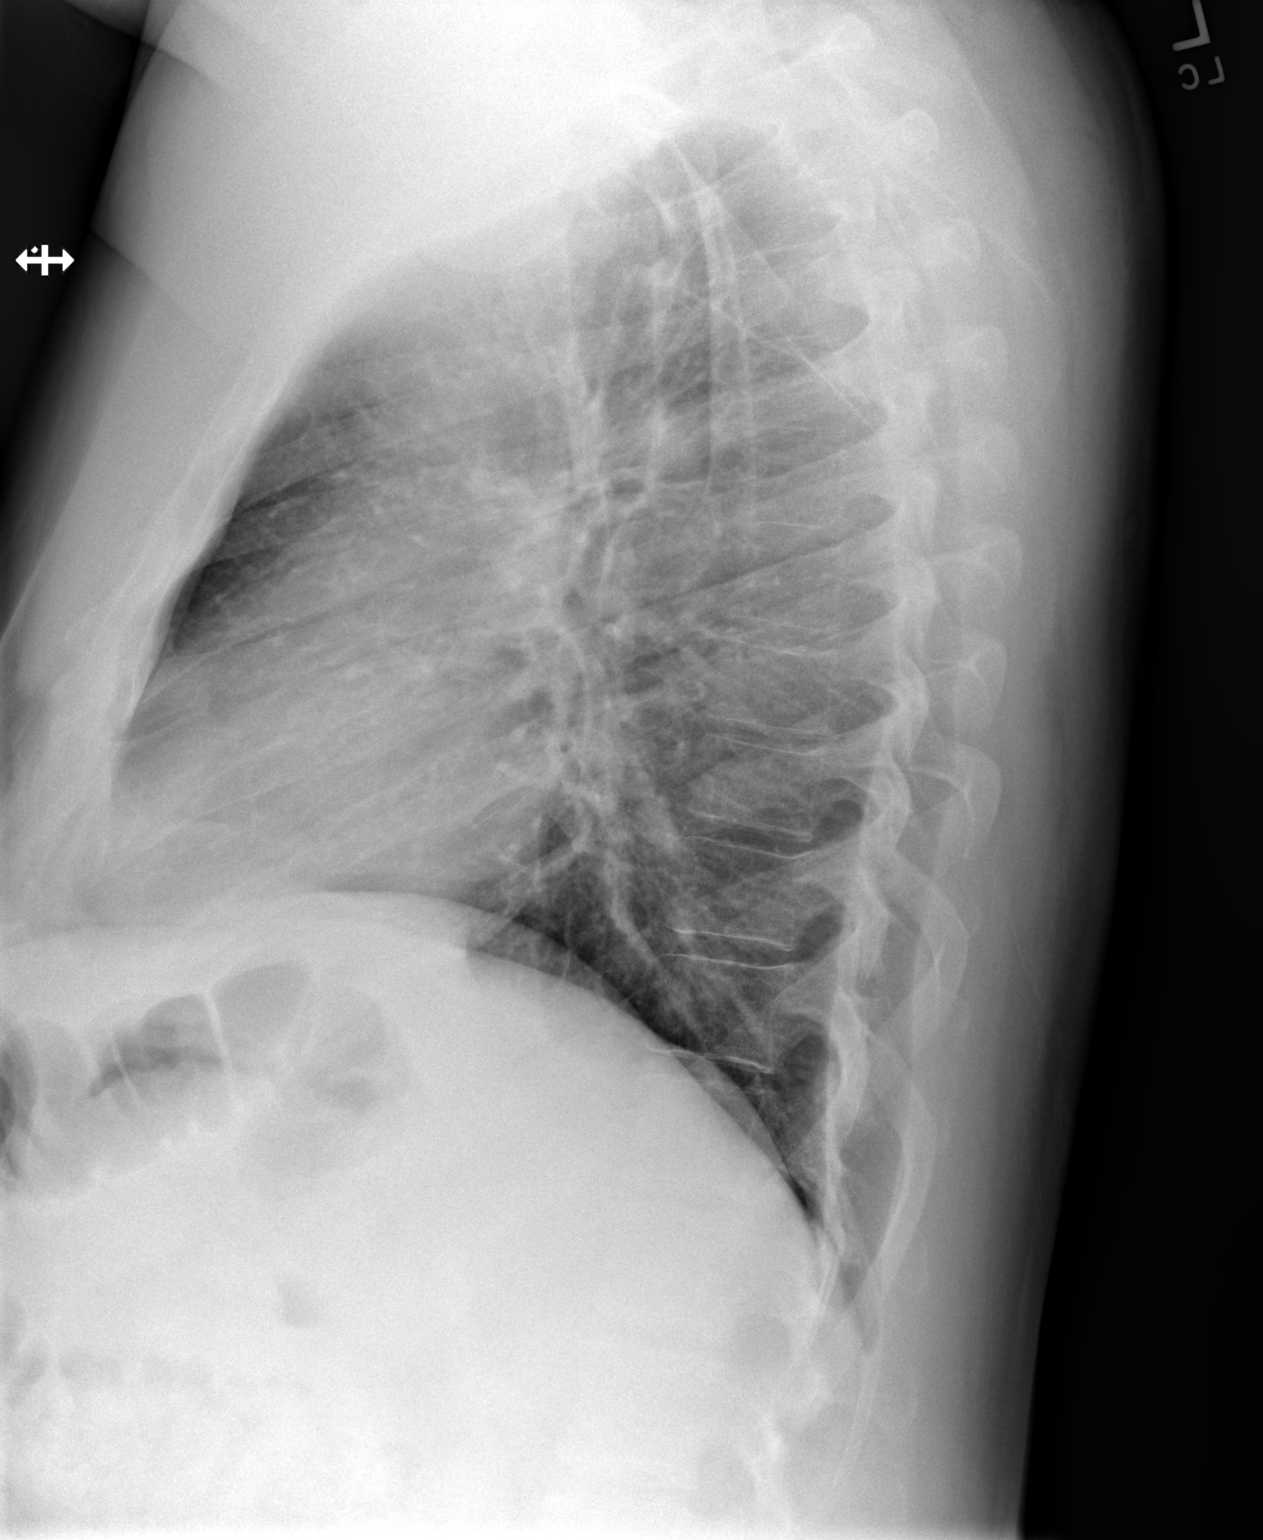

[2 of 2 positions shown; findings below may reference images not displayed]

FINDINGS: HEART AND MEDIASTINUM: Within normal limits.

LUNGS: Linear area of atelectasis identified left lower lobe of the lung. Remaining lungs are clear.
IMPRESSION: 1.
Linear area of atelectasis identified left lower lobe of the lung.

2.
The remaining lungs are clear.

## 2021-04-12 IMAGING — US DOP VENOUS LWR EXT RT
1 series · 14 of 24 positions shown · non-contrast
Comparison: None.

REASON FOR EXAM: Localized swelling, mass and lump, right lower limb; status post right calf fasciotomy.
TECHNIQUE: Multiple images from a real-time, duplex and color Doppler ultrasound of the venous system of the right leg is performed. Spectral Doppler analysis was also performed.

[Series 1: dop venous lwr ext right · portal-venous · 14 of 42 slices shown]
[im 1/42]
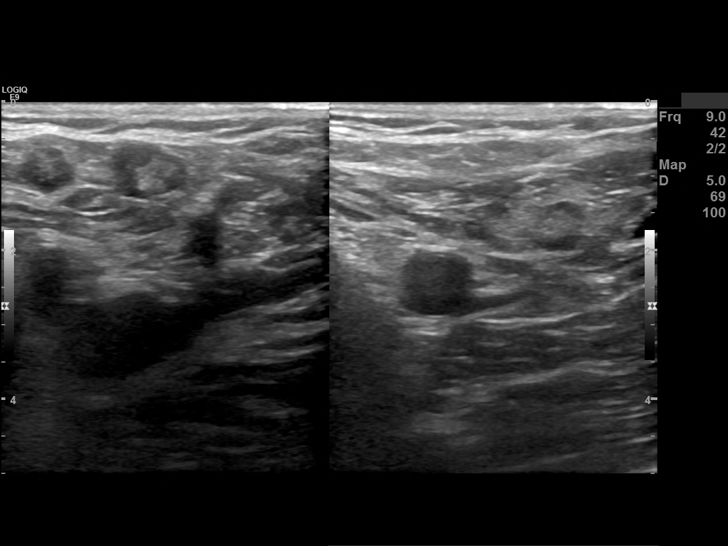
[im 4/42]
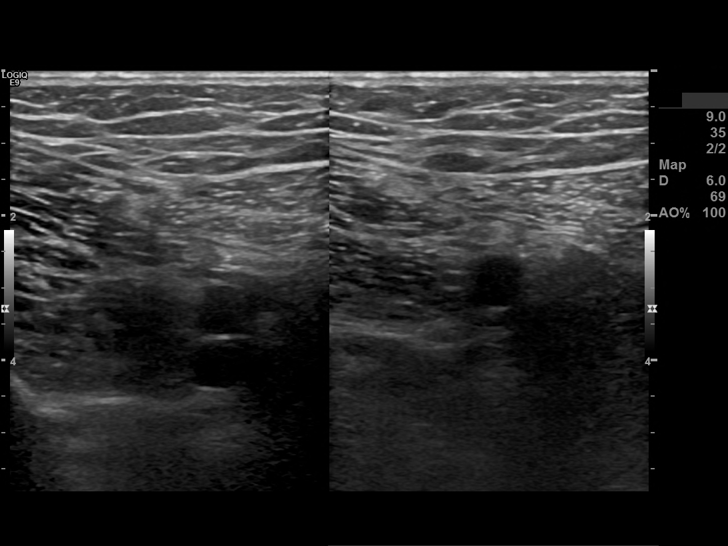
[im 8/42]
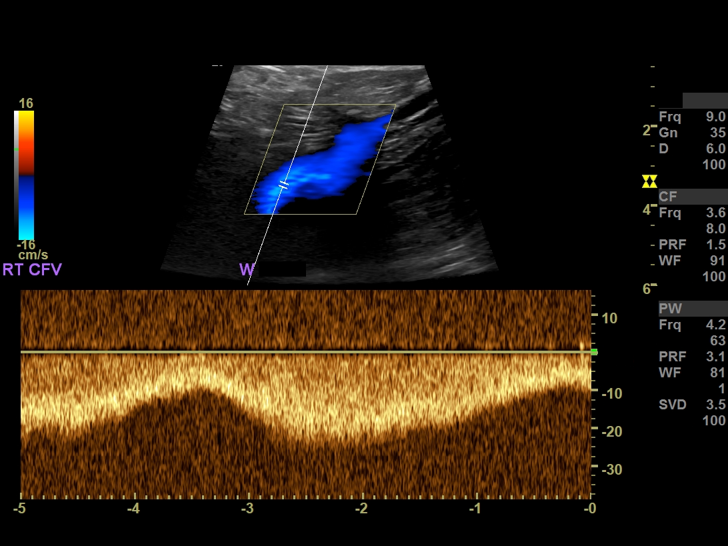
[im 11/42]
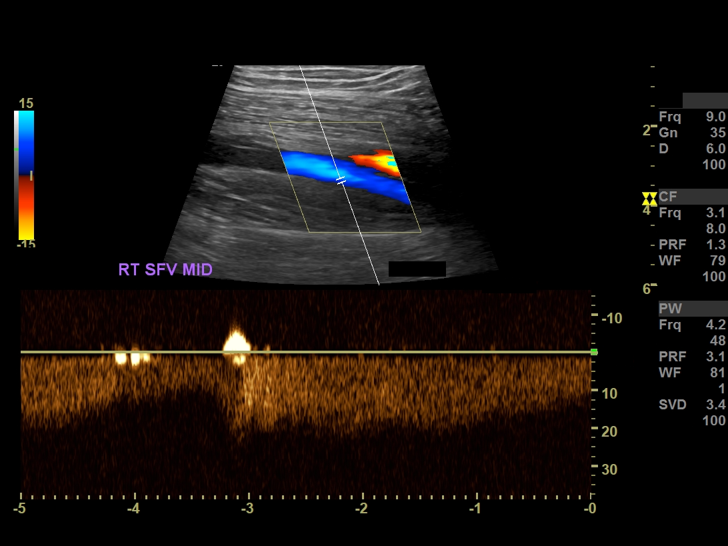
[im 13/42]
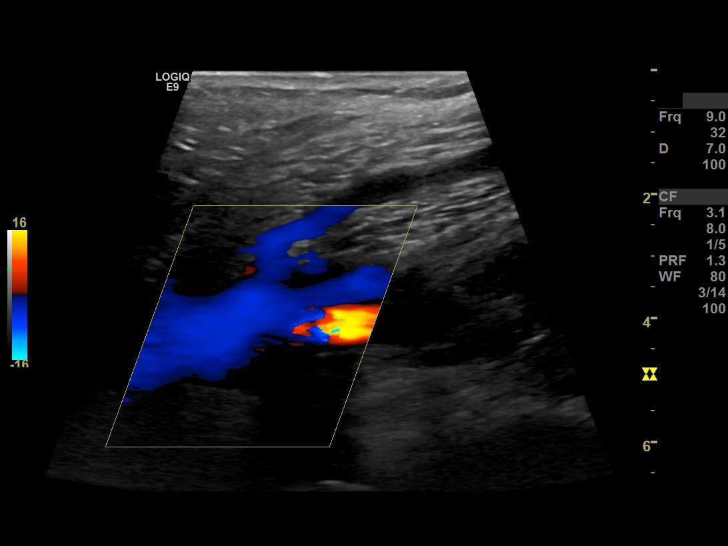
[im 17/42]
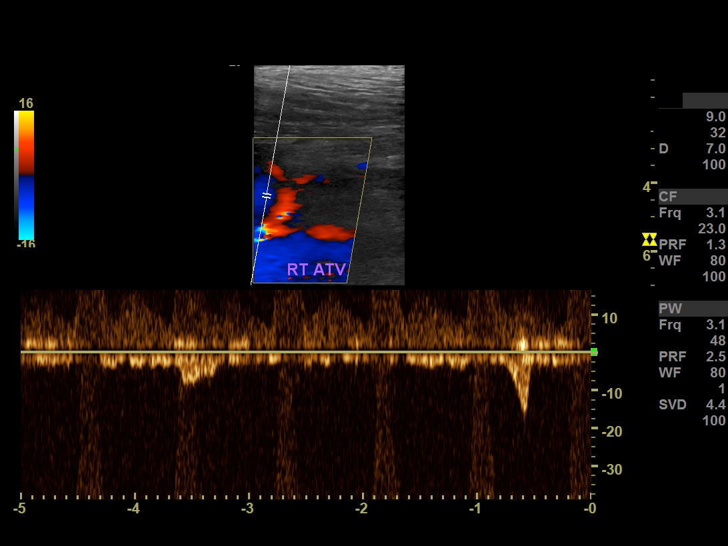
[im 20/42]
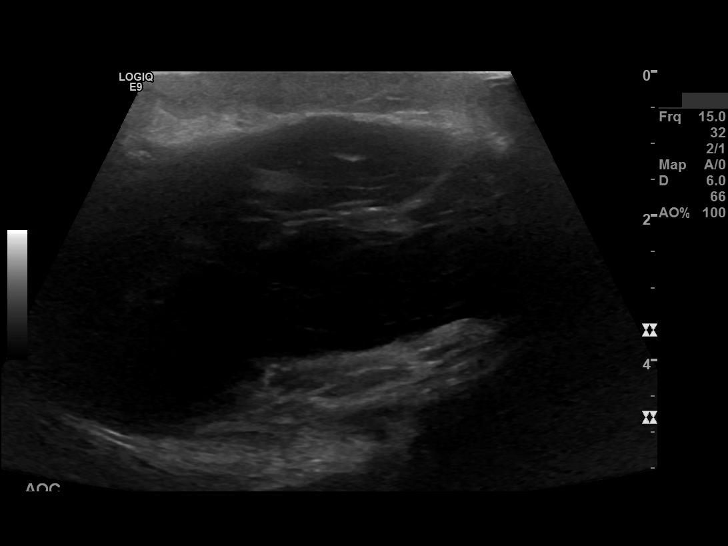
[im 22/42]
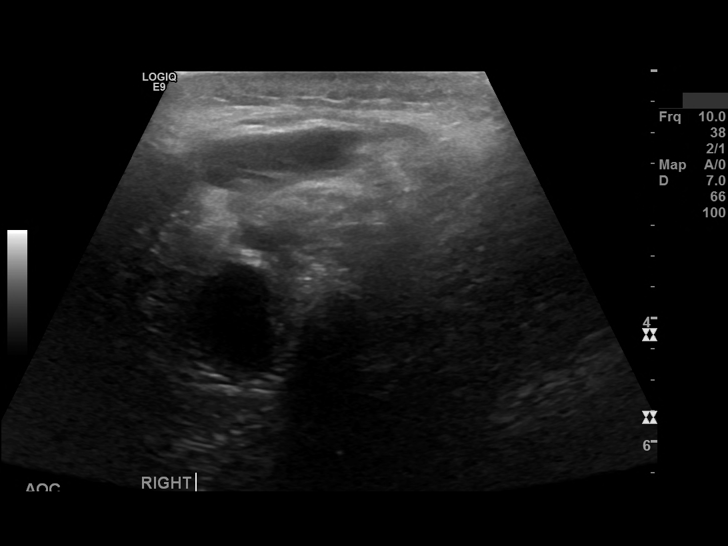
[im 25/42]
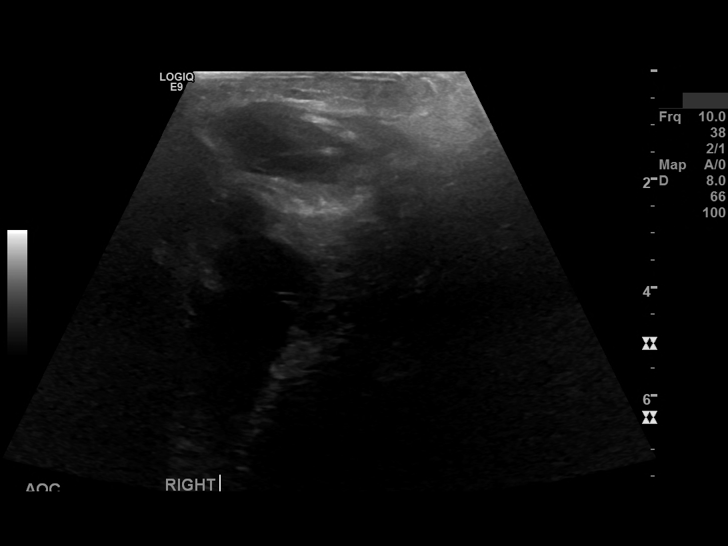
[im 29/42]
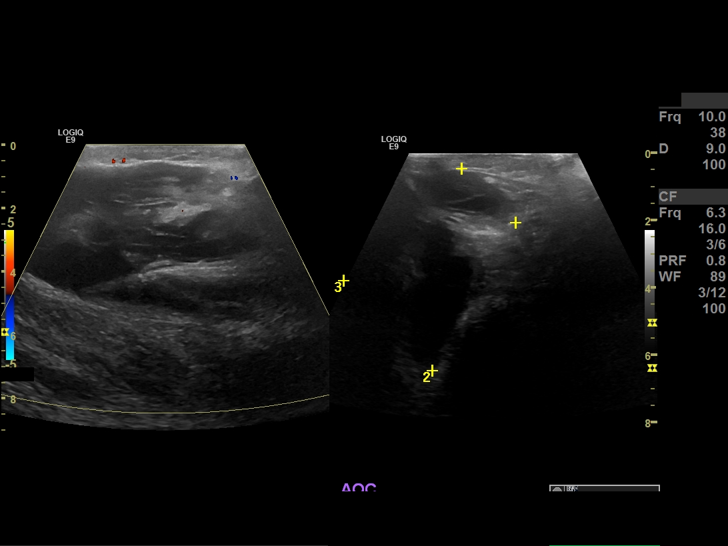
[im 33/42]
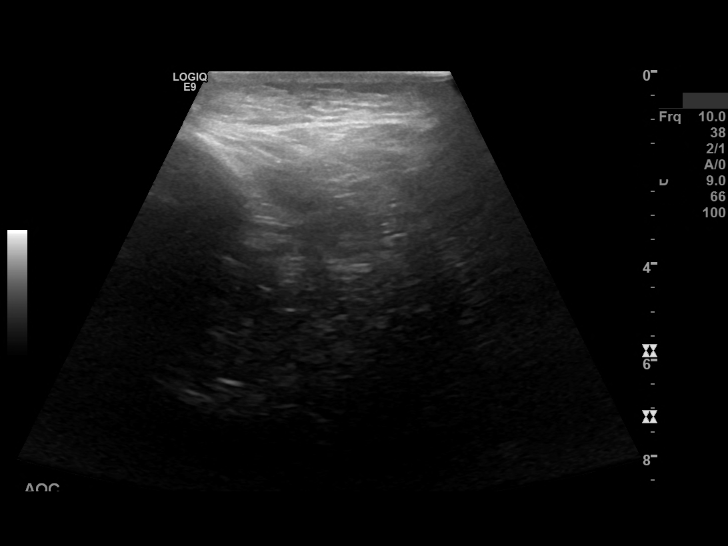
[im 34/42]
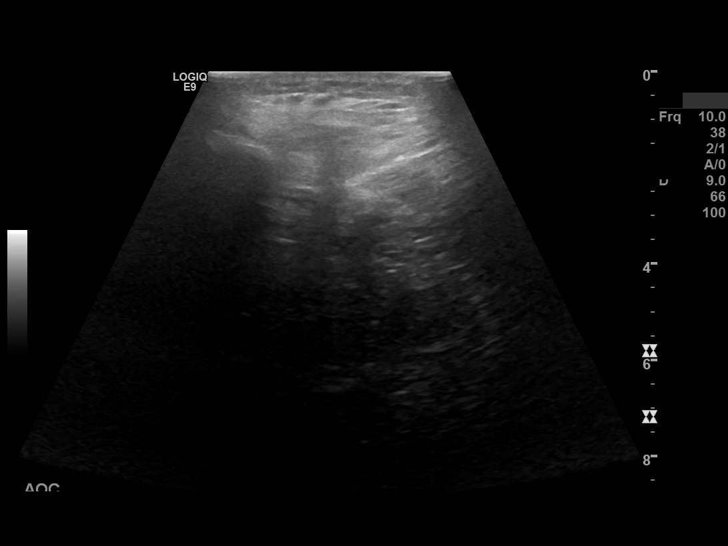
[im 38/42]
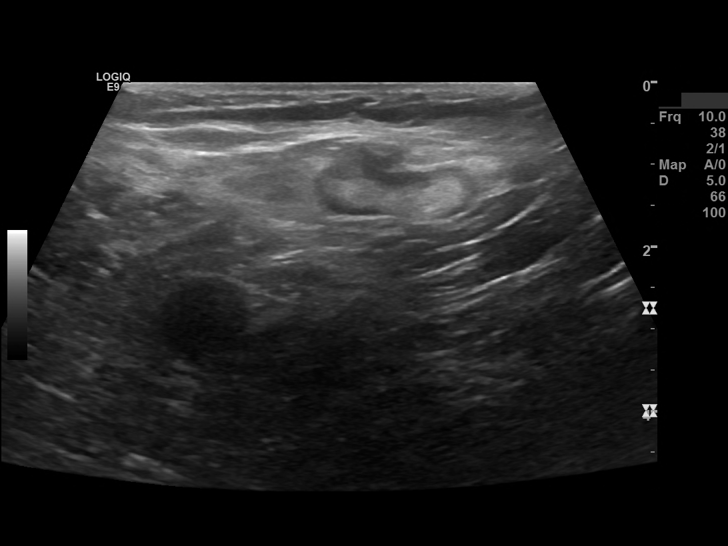
[im 42/42]
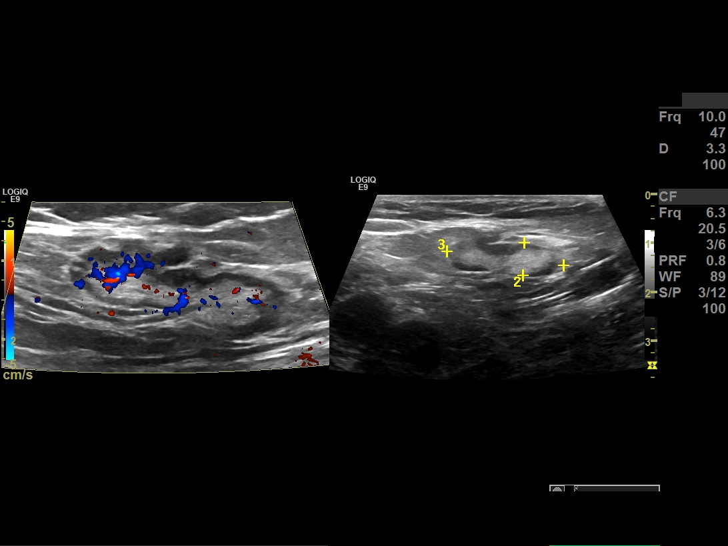

[14 of 24 positions shown; findings below may reference images not displayed]

FINDINGS: The right common femoral, superficial femoral, and popliteal veins are patent with normal compressibility and flow.

The right anterior tibial, posterior tibial, and peroneal vein pairs are patent with normal response to augmentation.

The right saphenous venous system is patent. No Baker's cyst is present, but there is a fluid collection in the lateral calf at fasciotomy site, measuring 99 x 61 x 54 mm.
IMPRESSION: 1. Negative venous Doppler of right leg for deep vein thrombosis.

2. Fluid collection in the lateral right calf at the fasciotomy site.

Report called to referring clinician, 04/12/21, [DATE]

STAT fax

## 2021-06-22 IMAGING — CT CT THORAX WO CONTRAST
2 of 5 series · 12 of 36 positions shown, 15 images · non-contrast
Comparison: None.
Coronary Artery Calcification: Absent.

Images Obtained from [facility]
HISTORY: Shortness of breath.
TECHNIQUE: Axial images of the chest were obtained from the lung apices through the lung bases without intravenous contrast. Dose reduction technique used: Automated exposure control and adjustment
of the mA and/or kV according to patient size. CT Studies and Cardiac Nuclear Medicine Studies in last 12-months = 0

[Series 4: coronal · coronal · 0.81mm/px · 3 of 65 slices shown]
[im 13/65  lung]
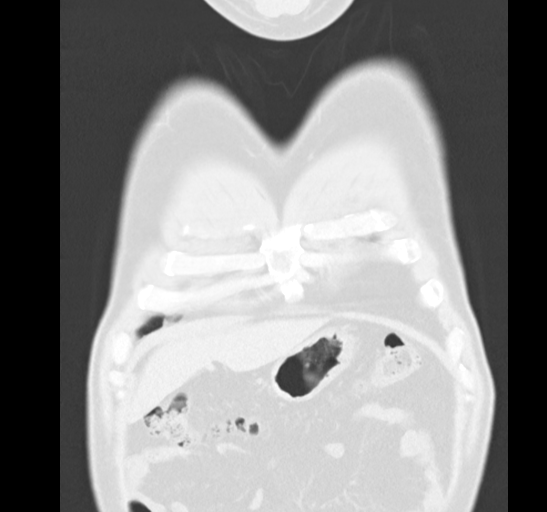
[im 26/65  lung]
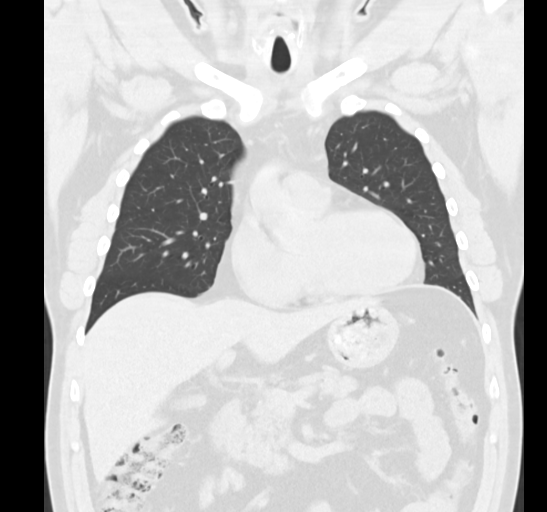
[im 39/65  lung]
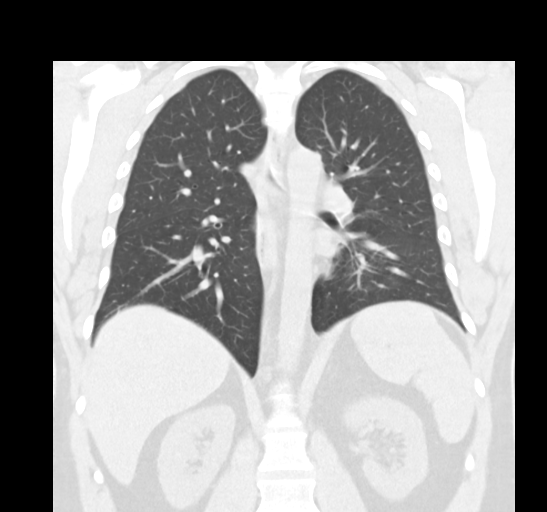

[Series 8: lung · axial · 0.64mm/px · z∈[-1292,-1064]mm · 9 of 132 slices shown, 12 images]
[im 9/132  mediastinal]
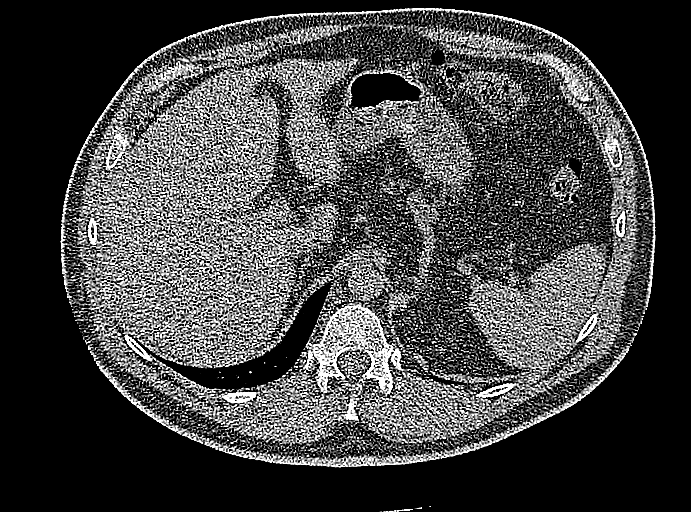
[im 9/132  lung]
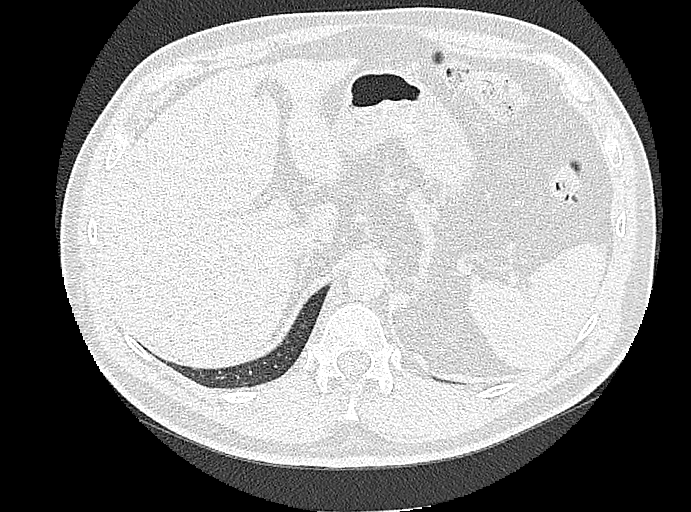
[im 25/132  lung]
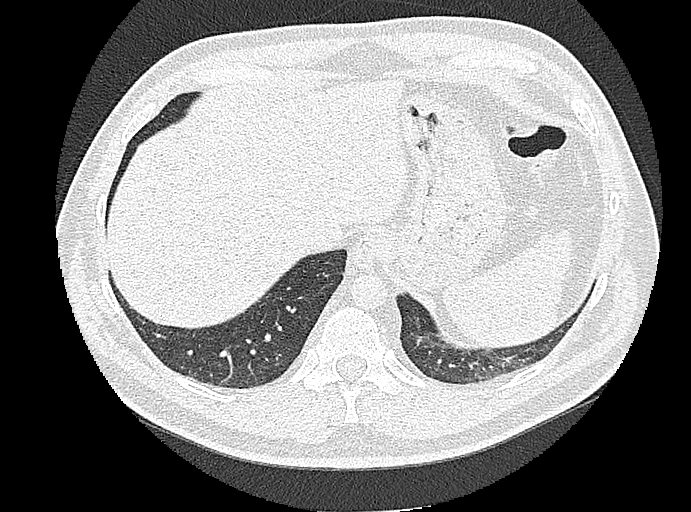
[im 41/132  lung]
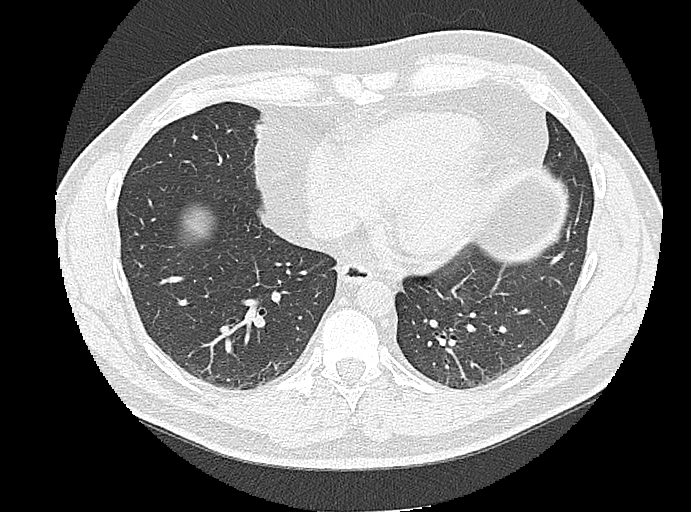
[im 50/132  lung]
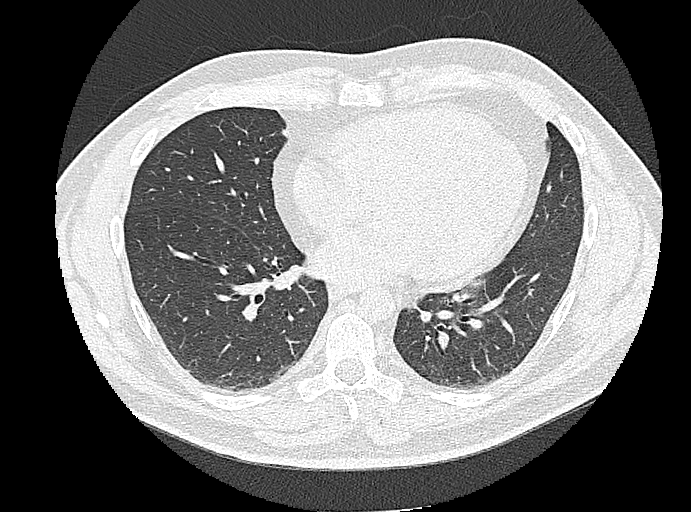
[im 66/132  mediastinal]
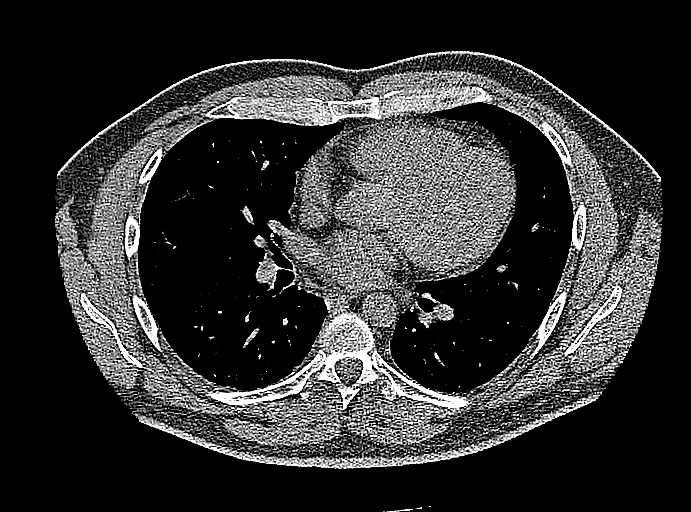
[im 66/132  lung]
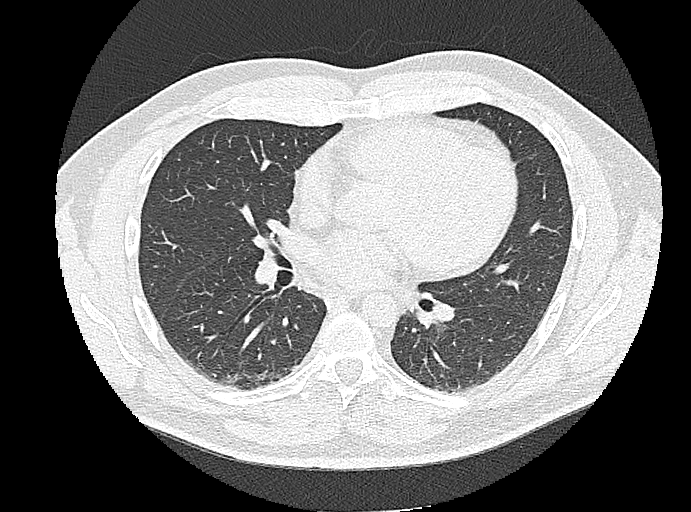
[im 82/132  lung]
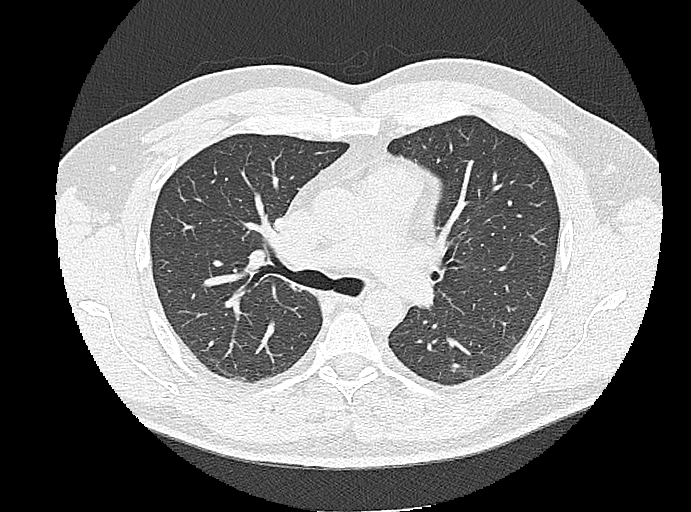
[im 91/132  lung]
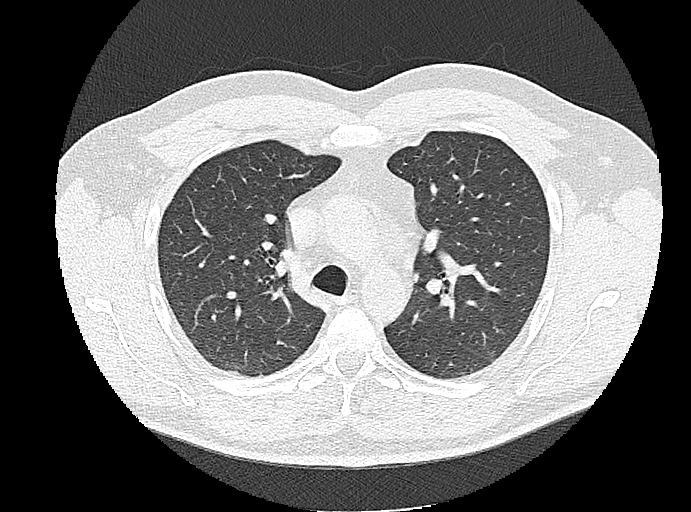
[im 107/132  lung]
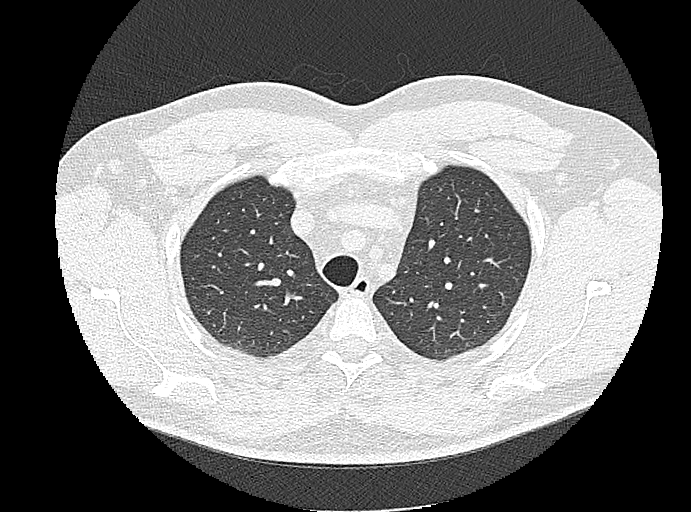
[im 123/132  mediastinal]
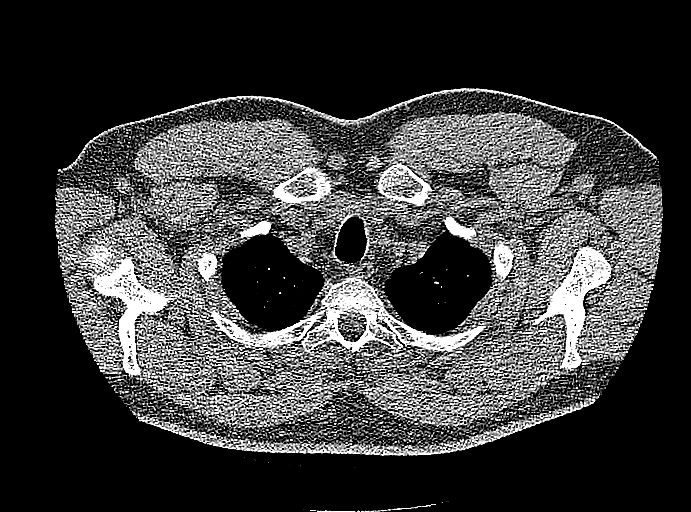
[im 123/132  lung]
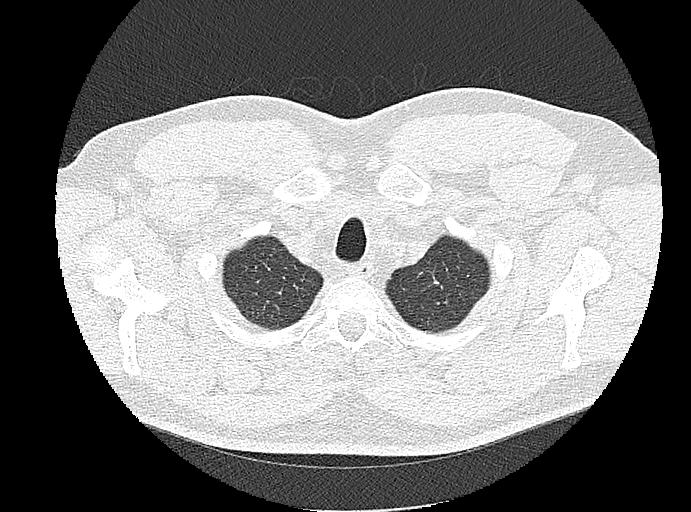

[12 of 36 positions shown; findings below may reference images not displayed]

FINDINGS: The central airways are patent.
The lungs do not show any focal infiltrate or consolidation. There is no bronchiectasis or significant parabronchial cuffing. Interstitium is fairly normal in appearance. A punctate granuloma is seen
in the left upper lung. Another is seen in the right lower lung zone.
There are no pleural effusions.
The heart is normal in size without significant pericardial effusion.
There is no mediastinal, hilar, or axillary lymphadenopathy.
Limited evaluation of the upper abdomen is unremarkable, aside from a few diverticula about the splenic flexure.
The osseous structures are unremarkable.
IMPRESSION: 1.  Negative CT of the chest for identification of acute or significant pathology.
2.  Mild diverticulosis.
Total radiation dose to patient is CTDIvol 3.30 mGy and DLP 151.00 mGy-cm.

## 2022-03-12 IMAGING — CR L-SPINE 2-3 VWS
1 series · 3 of 3 positions shown · non-contrast
Comparison: None

Images Obtained from [facility]
Lumbar spine radiographs, 3 views
INDICATION: Encounter for disability determination

[Series 1: AP · 0.17mm/px · 3 of 3 slices shown]
[im 1/3]
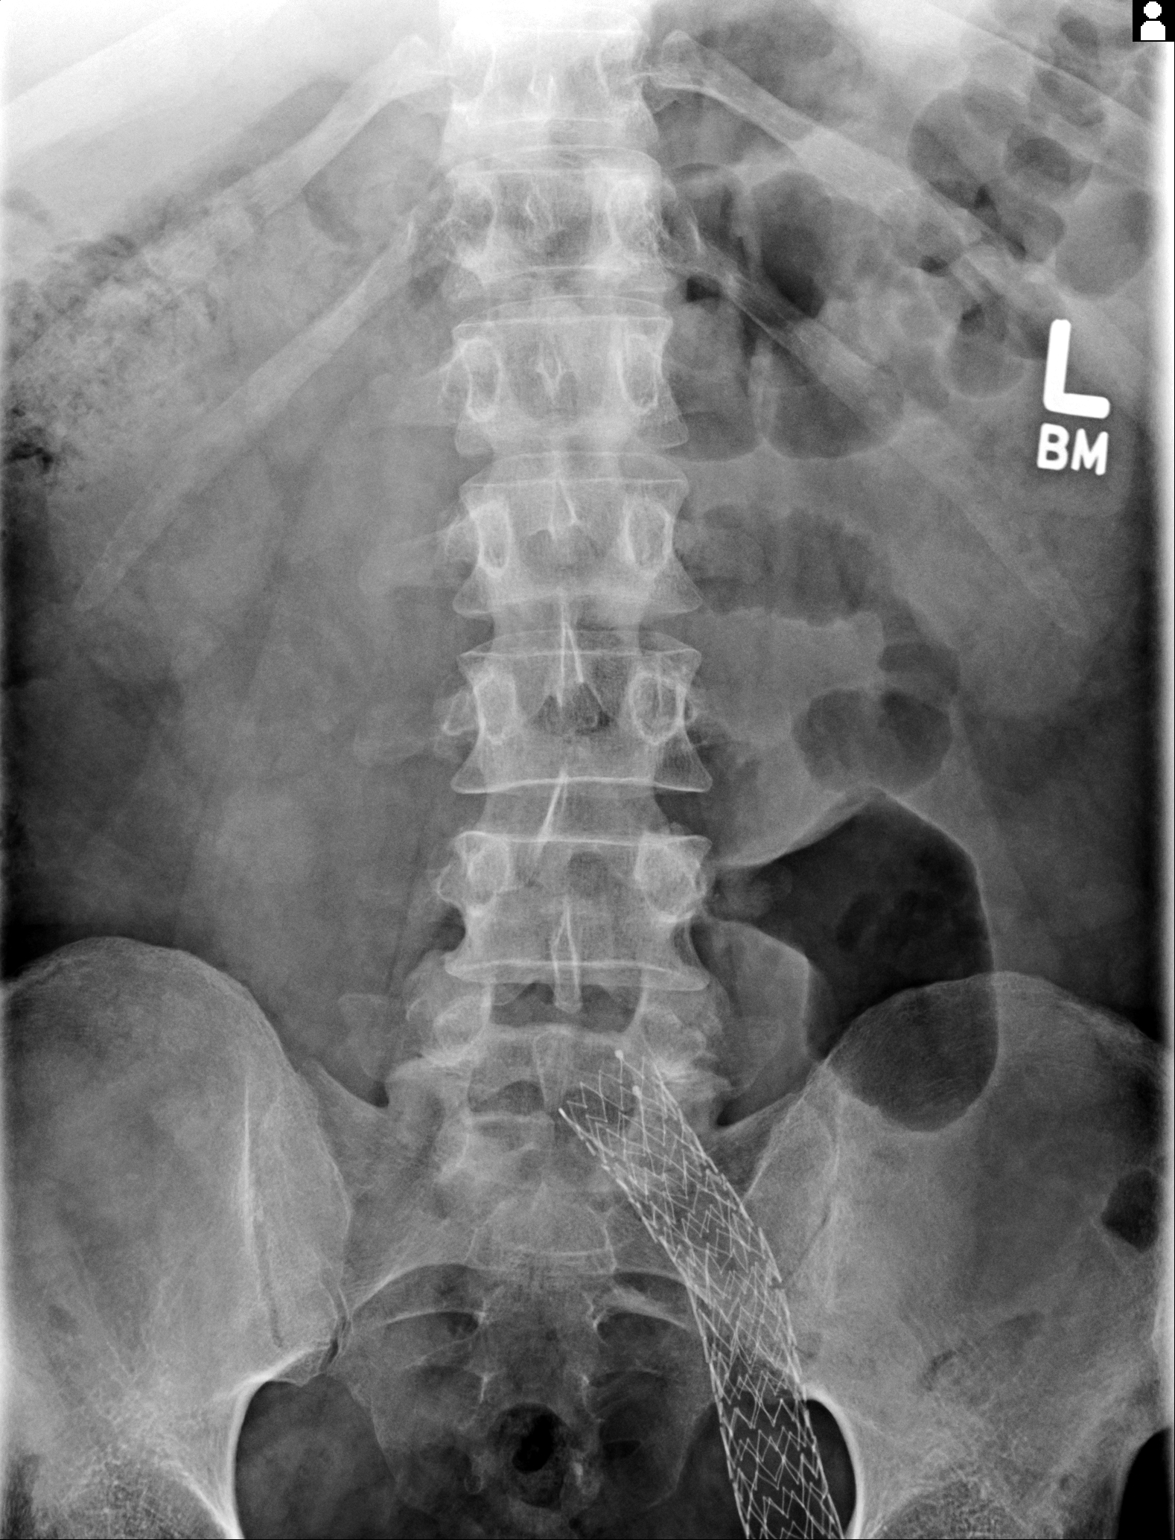
[im 2/3]
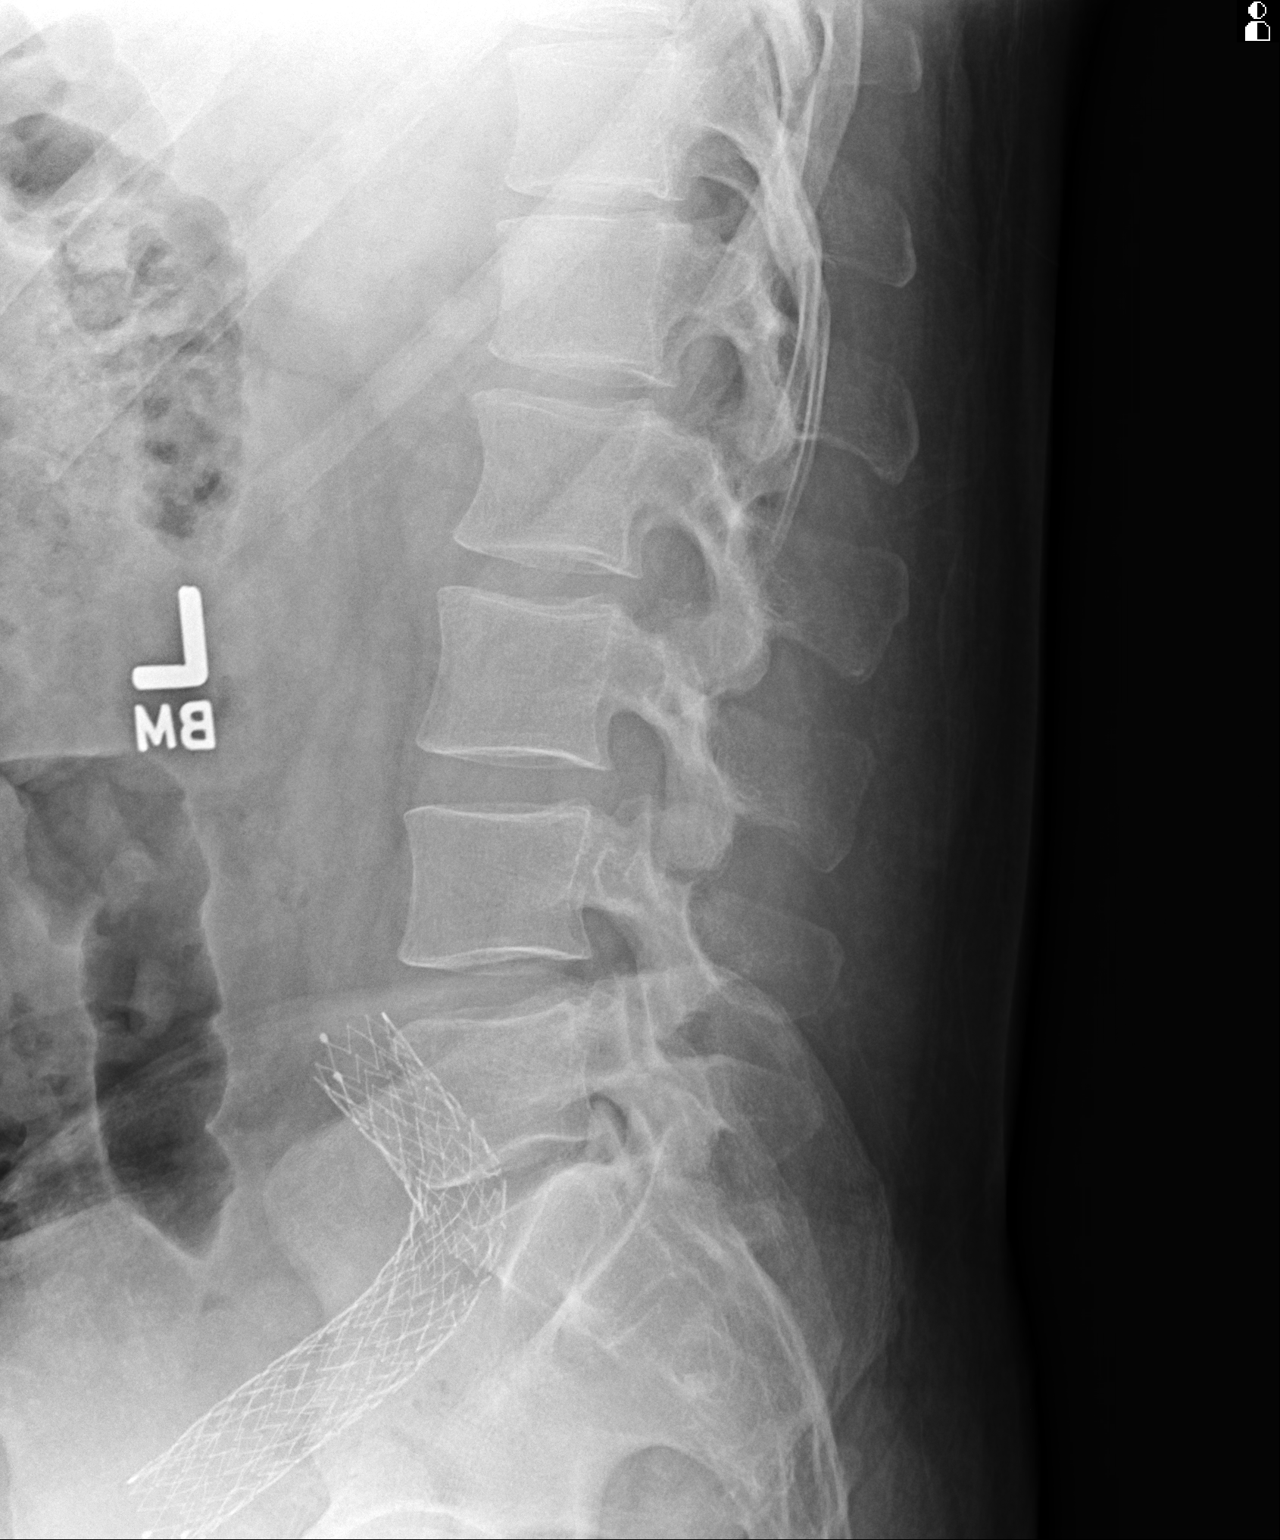
[im 3/3]
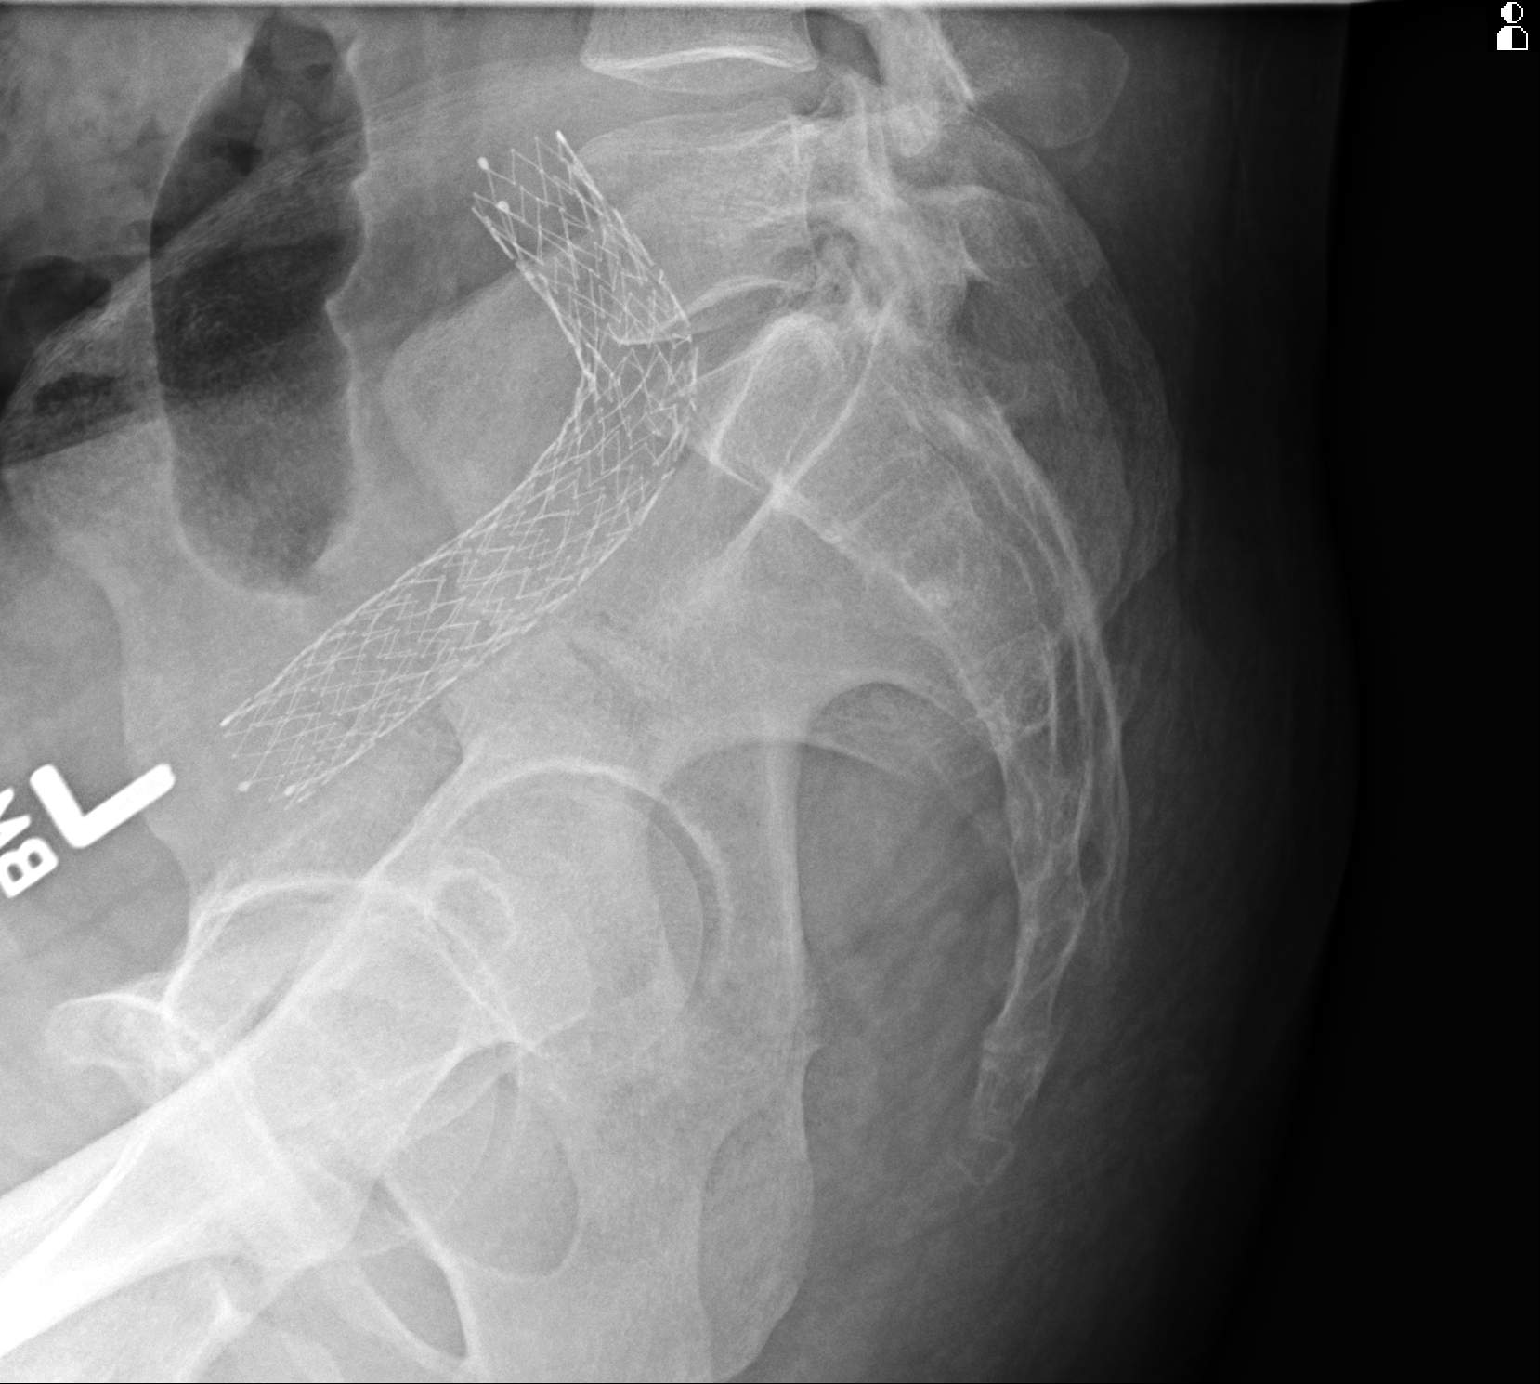

[3 of 3 positions shown; findings below may reference images not displayed]

FINDINGS: Minimal lumbar levoscoliosis. No acute fracture. No pars defect. No facet arthrosis. Disc heights are intact. SI joints are intact.
IMPRESSION: Minimal lumbar levoscoliosis with no significant degenerative changes.

## 2022-03-12 IMAGING — CR SINUS 3 VWS MIN
2 series · 4 of 4 positions shown · non-contrast
Comparison: None.

Images Obtained from [facility]
HISTORY: Encounter for disability determination
TECHNIQUE: 4 views of the sinuses are submitted.

[Series 1: lat · 0.17mm/px · 3 of 3 slices shown]
[im 1/3]
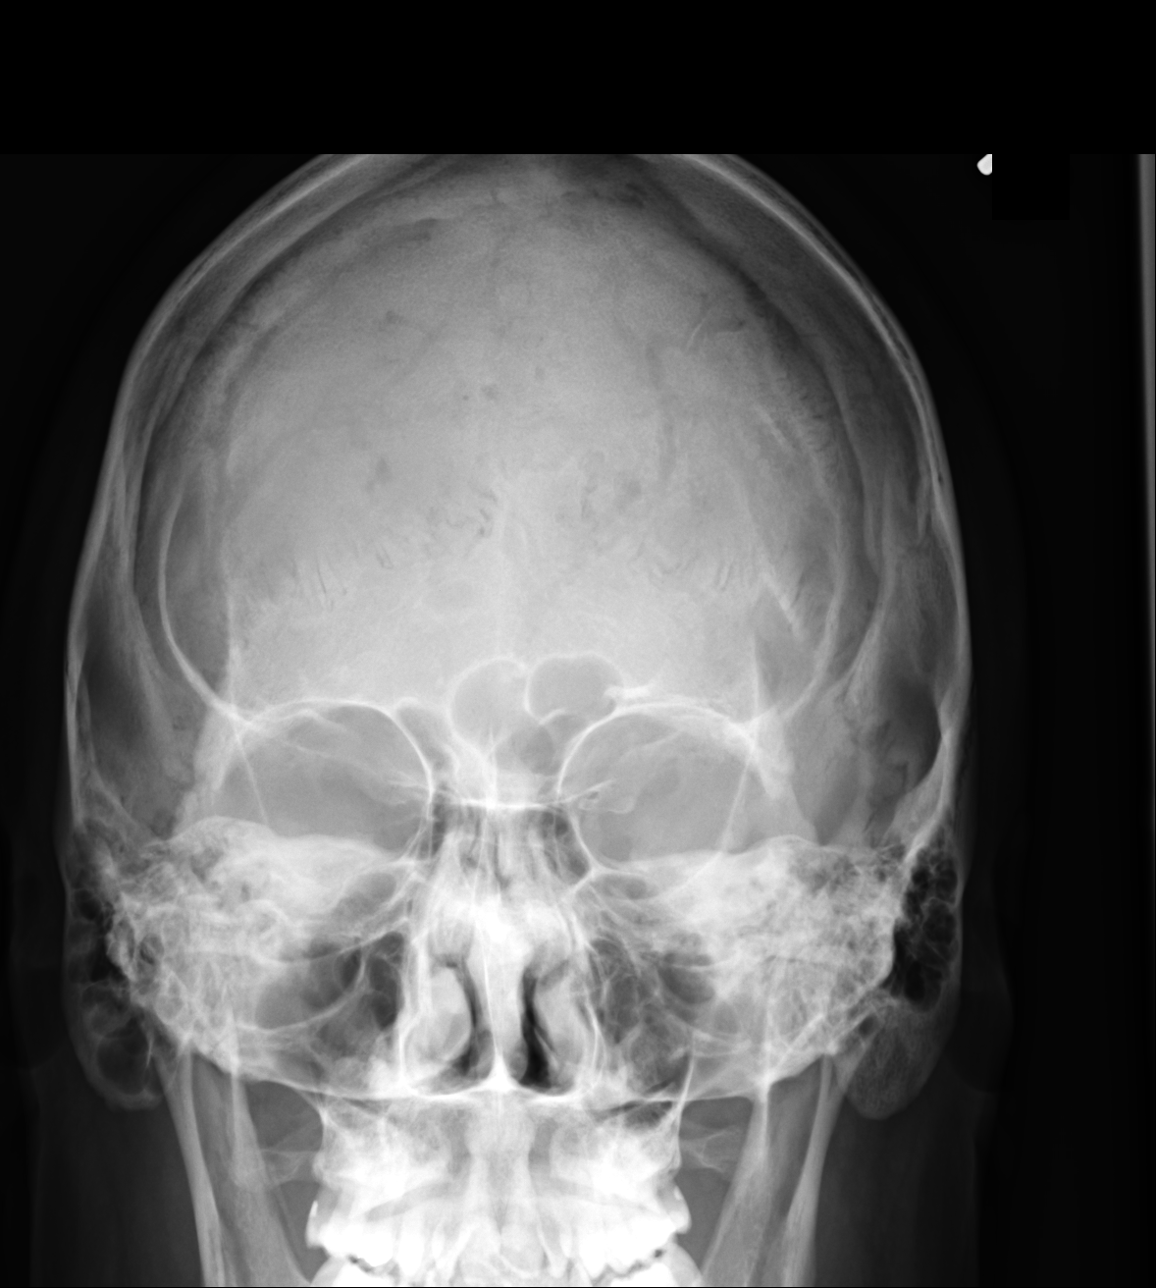
[im 2/3]
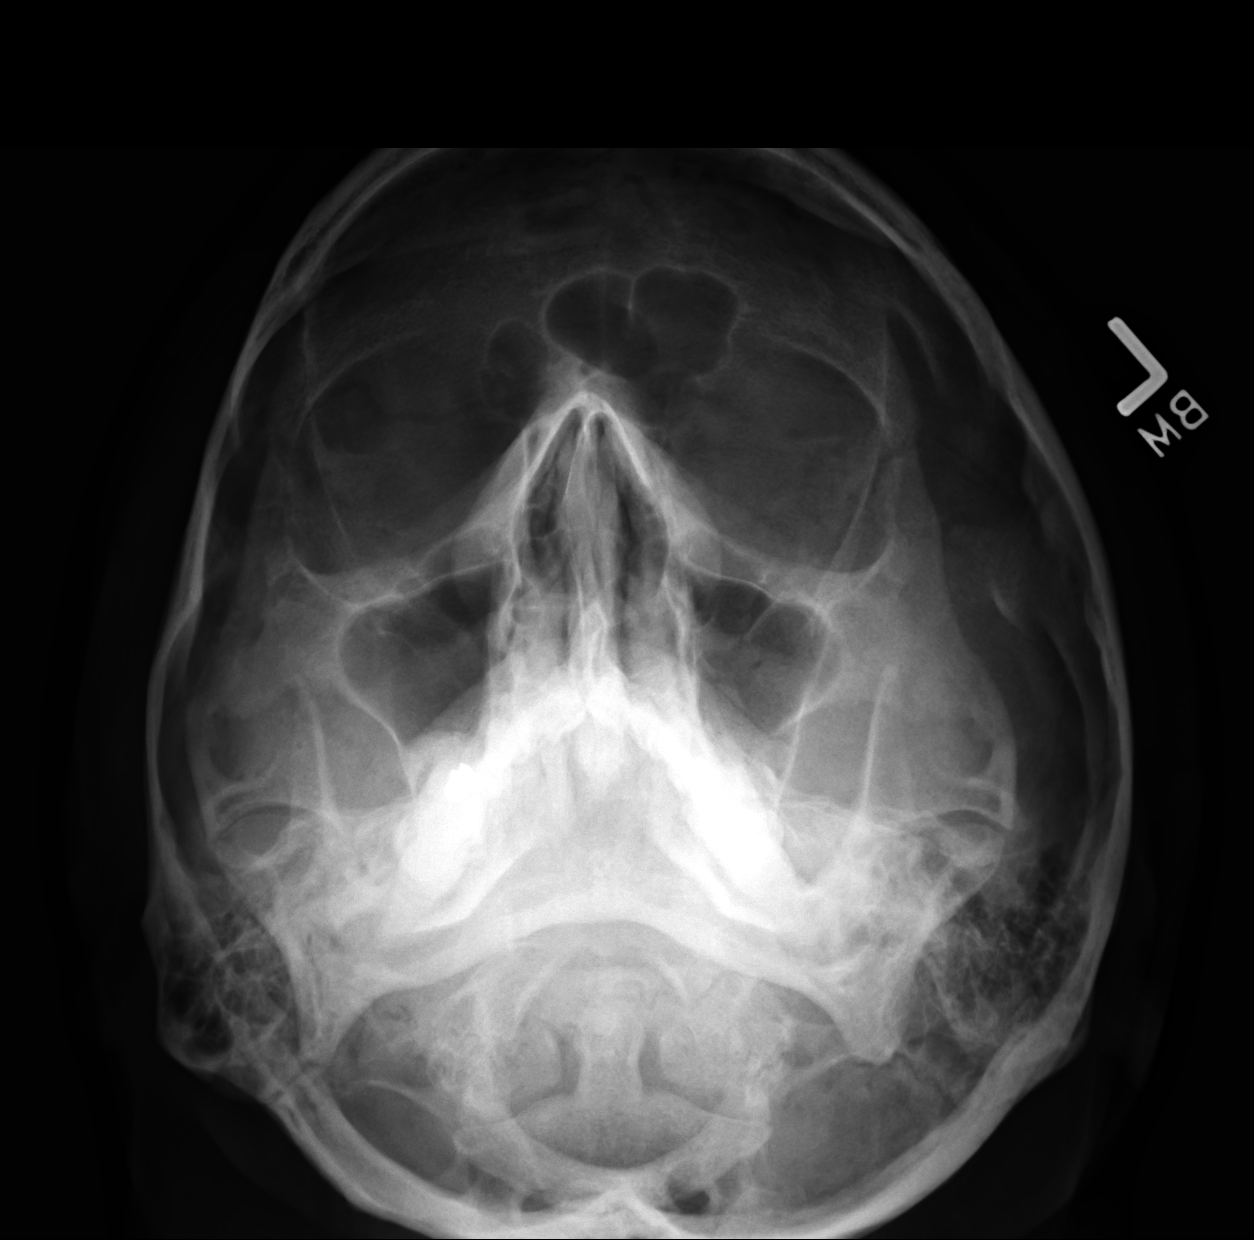
[im 3/3]
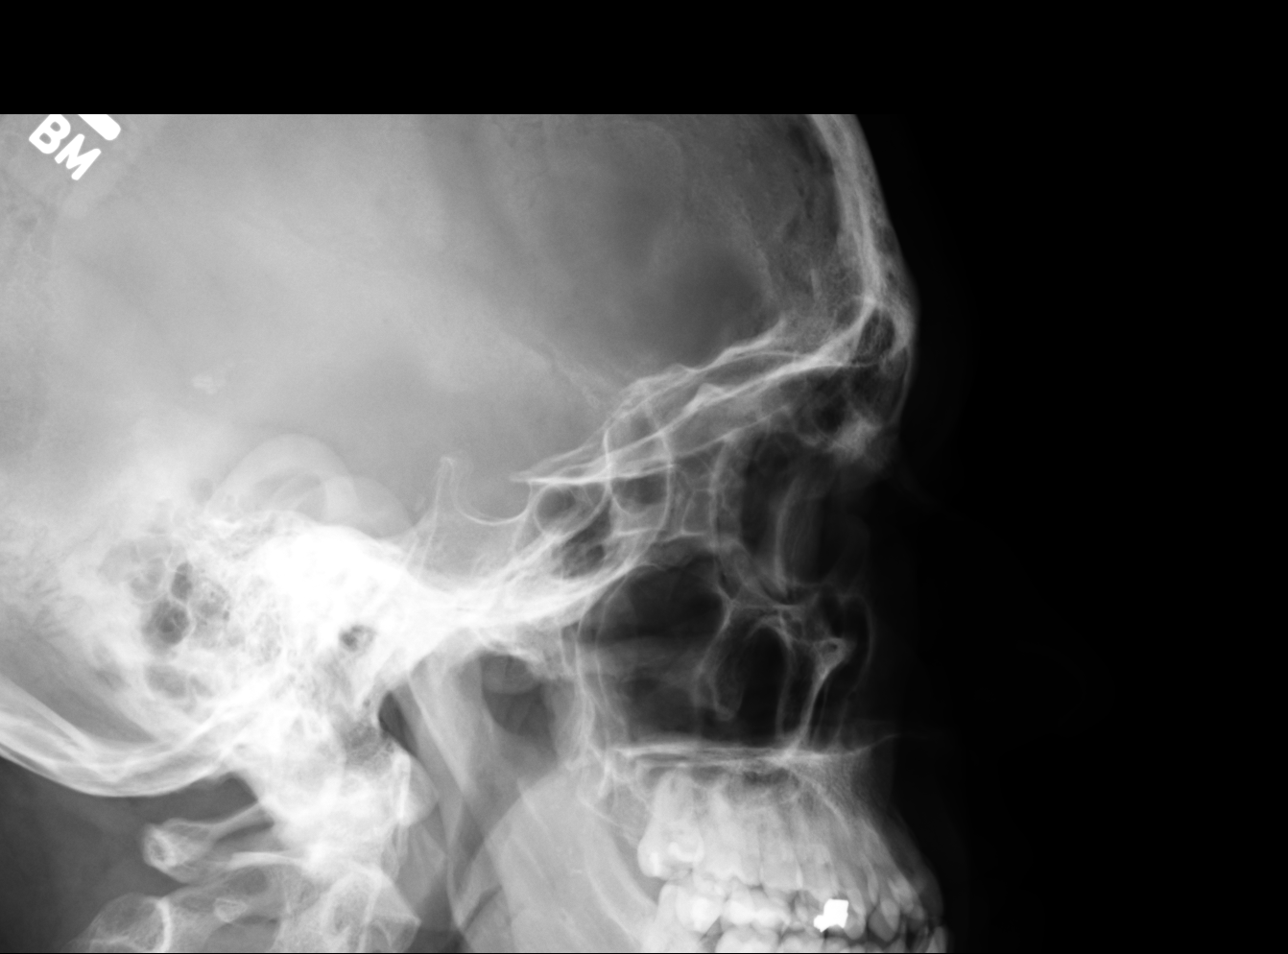

[smv]
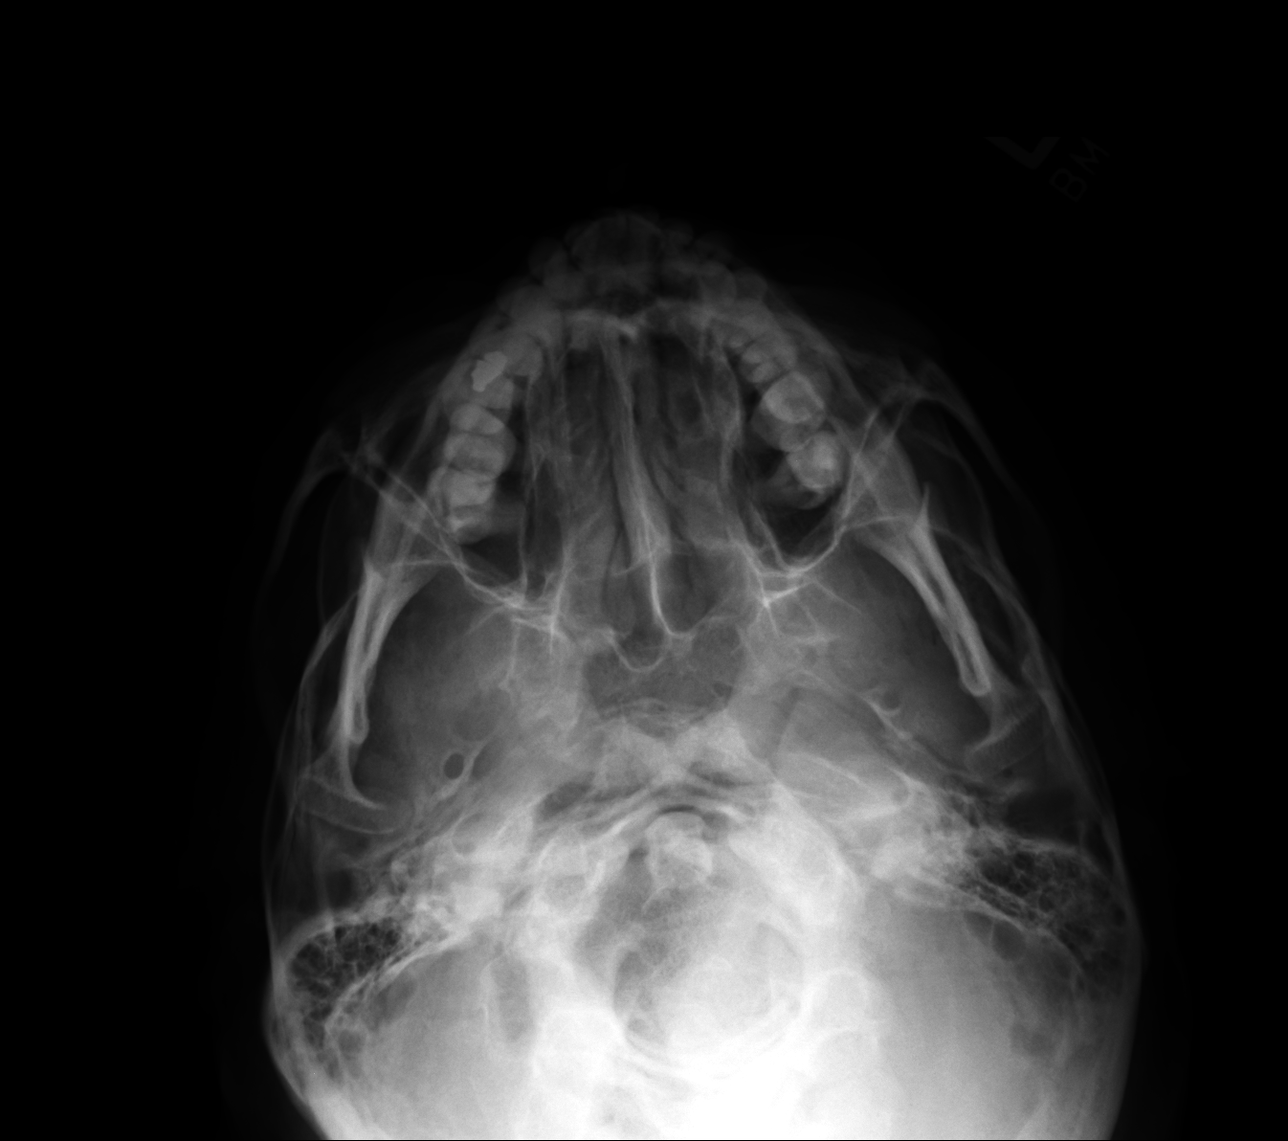

[4 of 4 positions shown; findings below may reference images not displayed]

FINDINGS: The paranasal sinuses appear well aerated and clear. No air-fluid levels are seen. Osseous structures appear intact.
IMPRESSION: No radiographic evidence of sinusitis.
